# Patient Record
Sex: Male | Born: 1985 | Race: Black or African American | Hispanic: No | Marital: Married | Smoking: Never smoker
Health system: Southern US, Community
[De-identification: ages and names within clinical notes are randomized; demographics above are authoritative.]

## PROBLEM LIST (undated history)

## (undated) DIAGNOSIS — R5383 Other fatigue: Secondary | ICD-10-CM

## (undated) DIAGNOSIS — R55 Syncope and collapse: Secondary | ICD-10-CM

## (undated) DIAGNOSIS — Z8659 Personal history of other mental and behavioral disorders: Secondary | ICD-10-CM

## (undated) DIAGNOSIS — R419 Unspecified symptoms and signs involving cognitive functions and awareness: Secondary | ICD-10-CM

## (undated) HISTORY — DX: Syncope and collapse: R55

## (undated) HISTORY — PX: NO PAST SURGERIES: SHX2092

## (undated) HISTORY — DX: Personal history of other mental and behavioral disorders: Z86.59

---
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
## 2011-11-06 ENCOUNTER — Encounter (HOSPITAL_COMMUNITY): Payer: Self-pay | Admitting: Emergency Medicine

## 2011-11-06 ENCOUNTER — Emergency Department (HOSPITAL_COMMUNITY)
Admission: EM | Admit: 2011-11-06 | Discharge: 2011-11-06 | Disposition: A | Discharge status: Home / Self Care | Payer: No Typology Code available for payment source | Attending: Emergency Medicine | Admitting: Emergency Medicine

## 2011-11-06 ENCOUNTER — Emergency Department (HOSPITAL_COMMUNITY): Payer: No Typology Code available for payment source

## 2011-11-06 DIAGNOSIS — M542 Cervicalgia: Secondary | ICD-10-CM | POA: Insufficient documentation

## 2011-11-06 DIAGNOSIS — R55 Syncope and collapse: Secondary | ICD-10-CM | POA: Insufficient documentation

## 2011-11-06 DIAGNOSIS — Y92410 Unspecified street and highway as the place of occurrence of the external cause: Secondary | ICD-10-CM | POA: Insufficient documentation

## 2011-11-06 MED ORDER — LORAZEPAM 1 MG PO TABS
1.0000 mg | ORAL_TABLET | Freq: Once | ORAL | Status: AC
Start: 2011-11-06 — End: 2011-11-06
  Administered 2011-11-06: 1 mg via ORAL
  Filled 2011-11-06: qty 1

## 2011-11-06 MED ORDER — MELOXICAM 15 MG PO TABS: 15.0000 mg | ORAL_TABLET | Freq: Every day | ORAL | Status: DC

## 2011-11-06 MED ORDER — OXYCODONE-ACETAMINOPHEN 5-325 MG PO TABS
1.0000 | ORAL_TABLET | Freq: Once | ORAL | Status: AC
  Administered 2011-11-06: 1 via ORAL
  Filled 2011-11-06: qty 1

## 2011-11-06 NOTE — ED Notes (Signed)
Back board removed with assist of PA

## 2011-11-06 NOTE — ED Notes (Signed)
Patient given discharge instructions, information, prescriptions, and diet order. Patient states that they adequately understand discharge information given and to return to ED if symptoms return or worsen.     
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
## 2011-11-06 NOTE — ED Notes (Signed)
ZOX:[id number]<AV> Expected date:<BR> Expected time:<BR> Means of arrival:<BR> Comments:<BR> Hold for triage 6

## 2011-11-06 NOTE — ED Notes (Signed)
PA at bedside.
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
## 2011-11-11 NOTE — ED Provider Notes (Signed)
Medical screening examination/treatment/procedure(s) were conducted as a shared visit with non-physician practitioner(s) and myself.  I personally evaluated the patient during the encounter  Toy Baker, MD 11/11/11 [phone]

## 2012-12-28 ENCOUNTER — Encounter (HOSPITAL_COMMUNITY): Payer: Self-pay | Admitting: Emergency Medicine

## 2012-12-28 ENCOUNTER — Emergency Department (HOSPITAL_COMMUNITY)
Admission: EM | Admit: 2012-12-28 | Discharge: 2012-12-28 | Disposition: A | Discharge status: Home / Self Care | Payer: BC Managed Care – PPO | Attending: Emergency Medicine | Admitting: Emergency Medicine

## 2012-12-28 DIAGNOSIS — M545 Low back pain, unspecified: Secondary | ICD-10-CM | POA: Insufficient documentation

## 2012-12-28 MED ORDER — METHOCARBAMOL 500 MG PO TABS: 500.0000 mg | ORAL_TABLET | Freq: Two times a day (BID) | ORAL | Status: DC

## 2012-12-28 MED ORDER — NAPROXEN 500 MG PO TABS: 500.0000 mg | ORAL_TABLET | Freq: Two times a day (BID) | ORAL | Status: DC

## 2012-12-28 MED ORDER — HYDROCODONE-ACETAMINOPHEN 5-325 MG PO TABS: 1.0000 | ORAL_TABLET | Freq: Four times a day (QID) | ORAL | Status: DC | PRN

## 2012-12-28 NOTE — ED Notes (Signed)
Pt c/o of right side flank pain that started 2 days. Denies n/v/d.

## 2012-12-28 NOTE — ED Provider Notes (Signed)
CSN: [id number]     Arrival date & time 12/28/12  1602 History   First MD Initiated Contact with Patient 12/28/12 1643     Chief Complaint  Patient presents with  . Back Pain   (Consider location/radiation/quality/duration/timing/severity/associated sxs/prior Treatment) HPI Comments: Patient presents with a chief complaint of right lower back pain.  He reports that the pain has been constant for the past 2 days.  Pain worse with movement.  Pain does not radiate.  He has taken Ibuprofen for the pain, but does not feel that it helps.  He reports that he works for [employer] and does a lot of lifting and bending at work.  He denies any acute injury or trauma.  Denies bowel or bladder incontinence, fever, chills, urinary symptoms, numbness, or tingling.    The history is provided by the patient.    History reviewed. No pertinent past medical history. History reviewed. No pertinent past surgical history. History reviewed. No pertinent family history. History  Substance Use Topics  . Smoking status: Never Smoker   . Smokeless tobacco: Not on file  . Alcohol Use: No    Review of Systems  Musculoskeletal: Positive for back pain.  All other systems reviewed and are negative.    Allergies  Review of patient's allergies indicates no known allergies.  Home Medications   Current Outpatient Rx  Name  Route  Sig  Dispense  Refill  . acetaminophen (TYLENOL) 500 MG tablet   Oral   Take 1,000 mg by mouth every 6 (six) hours as needed. For pain.         Marland Kitchen HYDROcodone-acetaminophen (NORCO/VICODIN) 5-325 MG per tablet   Oral   Take 1-2 tablets by mouth every 6 (six) hours as needed.   12 tablet   0   . meloxicam (MOBIC) 15 MG tablet   Oral   Take 1 tablet (15 mg total) by mouth daily.   10 tablet   0   . methocarbamol (ROBAXIN) 500 MG tablet   Oral   Take 1 tablet (500 mg total) by mouth 2 (two) times daily.   20 tablet   0   . naproxen (NAPROSYN) 500 MG tablet   Oral   Take 1  tablet (500 mg total) by mouth 2 (two) times daily.   30 tablet   0    BP 134/78  Pulse 69  Temp(Src) 98.9 F (37.2 C) (Oral)  Resp 17  SpO2 100% Physical Exam  Nursing note and vitals reviewed. Constitutional: He appears well-developed and well-nourished.  HENT:  Head: Normocephalic and atraumatic.  Mouth/Throat: Oropharynx is clear and moist.  Neck: Normal range of motion. Neck supple.  Cardiovascular: Normal rate, regular rhythm and normal heart sounds.   Pulmonary/Chest: Effort normal and breath sounds normal.  Musculoskeletal: Normal range of motion.       Cervical back: He exhibits normal range of motion, no tenderness, no bony tenderness, no swelling, no edema and no deformity.       Thoracic back: He exhibits normal range of motion, no tenderness, no bony tenderness, no swelling, no edema and no deformity.       Lumbar back: He exhibits normal range of motion, no bony tenderness, no swelling, no edema and no deformity.  Right paraspinal lumbar tenderness to palpation  Neurological: He is alert. He has normal strength. No sensory deficit. Gait normal.  Reflex Scores:      Patellar reflexes are 2+ on the right side and 2+ on the left  side.      Achilles reflexes are 2+ on the right side and 2+ on the left side. Skin: Skin is warm and dry.  Psychiatric: He has a normal mood and affect.    ED Course  Procedures (including critical care time) Labs Review Labs Reviewed - No data to display Imaging Review No results found.  EKG Interpretation   None       MDM   1. Lower back pain   Patient with back pain.  No neurological deficits and normal neuro exam.  Patient can walk but states is painful.  No loss of bowel or bladder control.  No concern for cauda equina.  No fever, night sweats, weight loss, or h/o cancer.  RICE protocol and pain medicine indicated and discussed with patient.   Return precautions given.     Santiago Glad, PA-C 12/28/12 2239

## 2012-12-28 NOTE — ED Provider Notes (Signed)
Medical screening examination/treatment/procedure(s) were performed by non-physician practitioner and as supervising physician I was immediately available for consultation/collaboration.    Wasim Hurlbut L Arelly Whittenberg, MD 12/28/12 2312 

## 2013-02-07 ENCOUNTER — Emergency Department (HOSPITAL_COMMUNITY): Payer: BC Managed Care – PPO

## 2013-02-07 ENCOUNTER — Encounter (HOSPITAL_COMMUNITY): Payer: Self-pay | Admitting: Emergency Medicine

## 2013-02-07 ENCOUNTER — Emergency Department (HOSPITAL_COMMUNITY)
Admission: EM | Admit: 2013-02-07 | Discharge: 2013-02-07 | Disposition: A | Discharge status: Home / Self Care | Payer: BC Managed Care – PPO | Attending: Emergency Medicine | Admitting: Emergency Medicine

## 2013-02-07 DIAGNOSIS — R109 Unspecified abdominal pain: Secondary | ICD-10-CM | POA: Insufficient documentation

## 2013-02-07 DIAGNOSIS — R630 Anorexia: Secondary | ICD-10-CM | POA: Insufficient documentation

## 2013-02-07 DIAGNOSIS — R5381 Other malaise: Secondary | ICD-10-CM | POA: Insufficient documentation

## 2013-02-07 DIAGNOSIS — R197 Diarrhea, unspecified: Secondary | ICD-10-CM | POA: Insufficient documentation

## 2013-02-07 DIAGNOSIS — R112 Nausea with vomiting, unspecified: Secondary | ICD-10-CM

## 2013-02-07 DIAGNOSIS — R6883 Chills (without fever): Secondary | ICD-10-CM | POA: Insufficient documentation

## 2013-02-07 LAB — COMPREHENSIVE METABOLIC PANEL
ALT: 18 U/L (ref 0–53)
Alkaline Phosphatase: 59 U/L (ref 39–117)
CO2: 26 mEq/L (ref 19–32)
GFR calc Af Amer: >90 mL/min (ref >90)
Glucose, Bld: 123 mg/dL — ABNORMAL HIGH (ref 70–99)
Potassium: 4.4 mEq/L (ref 3.5–5.1)
Sodium: 137 mEq/L (ref 135–145)
Total Protein: 8.6 g/dL — ABNORMAL HIGH (ref 6.0–8.3)

## 2013-02-07 LAB — CBC WITH DIFFERENTIAL/PLATELET
Basophils Absolute: 0 10*3/uL (ref 0.0–0.1)
Basophils Relative: 0 % (ref 0–1)
Eosinophils Absolute: 0 10*3/uL (ref 0.0–0.7)
Eosinophils Relative: 0 % (ref 0–5)
HCT: 43.1 % (ref 39.0–52.0)
MCH: 26.4 pg (ref 26.0–34.0)
MCHC: 32 g/dL (ref 30.0–36.0)
Monocytes Absolute: 0.3 10*3/uL (ref 0.1–1.0)
Neutro Abs: 10.5 10*3/uL — ABNORMAL HIGH (ref 1.7–7.7)
RDW: 13.5 % (ref 11.5–15.5)

## 2013-02-07 MED ORDER — HYDROCODONE-ACETAMINOPHEN 5-325 MG PO TABS: 1.0000 | ORAL_TABLET | Freq: Four times a day (QID) | ORAL | Status: DC | PRN

## 2013-02-07 MED ORDER — ONDANSETRON HCL 4 MG/2ML IJ SOLN
4.0000 mg | Freq: Once | INTRAMUSCULAR | Status: AC
  Administered 2013-02-07: 4 mg via INTRAVENOUS
  Filled 2013-02-07: qty 2

## 2013-02-07 MED ORDER — IOHEXOL 300 MG/ML  SOLN
100.0000 mL | Freq: Once | INTRAMUSCULAR | Status: AC | PRN
  Administered 2013-02-07: 100 mL via INTRAVENOUS

## 2013-02-07 MED ORDER — SODIUM CHLORIDE 0.9 % IV BOLUS (SEPSIS)
1000.0000 mL | Freq: Once | INTRAVENOUS | Status: AC
  Administered 2013-02-07: 1000 mL via INTRAVENOUS

## 2013-02-07 MED ORDER — ONDANSETRON 4 MG PO TBDP: 4.0000 mg | ORAL_TABLET | Freq: Three times a day (TID) | ORAL | Status: DC | PRN

## 2013-02-07 MED ORDER — IOHEXOL 300 MG/ML  SOLN
25.0000 mL | INTRAMUSCULAR | Status: AC
  Administered 2013-02-07: 25 mL via ORAL

## 2013-02-07 NOTE — ED Provider Notes (Signed)
I saw and evaluated the patient, reviewed the resident's note and I agree with the findings and plan.   .Face to face Exam:  General:  Awake HEENT:  Atraumatic Resp:  Normal effort Abd:  Nondistended Neuro:No focal weakness    Nelia Shi, MD 02/07/13 1701

## 2013-02-07 NOTE — ED Notes (Signed)
MD at bedside. Dr Beaton 

## 2013-02-07 NOTE — ED Provider Notes (Signed)
CSN: [id number]     Arrival date & time 02/07/13  1144 History   None    Chief Complaint  Patient presents with  . Abdominal Pain  . Emesis  . Diarrhea   (Consider location/radiation/quality/duration/timing/severity/associated sxs/prior Treatment) HPI Mr. Justin Novak is a 27 y.o. male w/ no known PMHx, presents to the ED w/ complaints of abdominal pain, nausea, vomiting, and diarrhea, since last night. He claims the pain started first, described as periumbilical in location, slightly more right-sided, sharp in nature, 8/10 in severity, worsened with movement. The patient then claims he had diarrhea to follow, then with nausea and vomiting. This AM, he claims he has not been able to keep anything down except for a small amount of water, and continued to have significant abdominal pain. He also describes some associated chills and decrease in appetite. He denies any recent sick contacts and said the only strange thing he ate in the past 24 hours was a milk shake last night.   History reviewed. No pertinent past medical history. History reviewed. No pertinent past surgical history. History reviewed. No pertinent family history. History  Substance Use Topics  . Smoking status: Never Smoker   . Smokeless tobacco: Not on file  . Alcohol Use: No    Review of Systems General: Positive for chills, fatigue, decreased appetite. Denies fever, diaphoresis.  Respiratory: Denies SOB, DOE, cough, chest tightness, and wheezing.   Cardiovascular: Denies chest pain, palpitations and leg swelling.  Gastrointestinal: Positive for nausea, vomiting, diarrhea, and abdominal pain. Denies constipation, blood in stool and abdominal distention.  Genitourinary: Denies dysuria, urgency, frequency, hematuria, flank pain and difficulty urinating.  Endocrine: Denies hot or cold intolerance, sweats, polyuria, polydipsia. Musculoskeletal: Denies myalgias, back pain, joint swelling, arthralgias and gait problem.   Skin: Denies pallor, rash and wounds.  Neurological: Denies dizziness, seizures, syncope, weakness, lightheadedness, numbness and headaches.  Psychiatric/Behavioral: Denies mood changes, confusion, nervousness, sleep disturbance and agitation.  Allergies  Review of patient's allergies indicates no known allergies.  Home Medications  No current outpatient prescriptions on file. Physical Exam Filed Vitals:   02/07/13 1148 02/07/13 1350 02/07/13 1500  BP: 134/80 133/70 123/52  Pulse: 81 86 77  Temp: 99 F (37.2 C)    TempSrc: Oral    Resp: 17 19   Weight: 166 lb (75.297 kg)    SpO2: 100% 100% 100%  General: Vital signs reviewed.  Patient is a well-developed and well-nourished, in no acute distress and cooperative with exam. Alert and oriented x3.  Head: Normocephalic and atraumatic. Eyes: PERRL, EOMI, conjunctivae normal, No scleral icterus.  Neck: Supple, trachea midline, normal ROM, No JVD, masses, thyromegaly, or carotid bruit present.  Cardiovascular: RRR, S1 normal, S2 normal, no murmurs, gallops, or rubs. Pulmonary/Chest: Normal respiratory effort, CTAB, no wheezes, rales, or rhonchi. Abdominal: Soft, tender to palpation in periumbilical region, RLQ and epigastrium, non-distended, bowel sounds are normal, no masses, organomegaly. Mild guarding present, no rebound tenderness. -ve Rovsing's sign, -ve psoas sign.  Musculoskeletal: No joint deformities, erythema, or stiffness, ROM full and no nontender. Extremities: No swelling or edema,  pulses symmetric and intact bilaterally. No cyanosis or clubbing. Neurological: A&O x3, Strength is normal and symmetric bilaterally, cranial nerve II-XII are grossly intact, no focal motor deficit, sensory intact to light touch bilaterally.  Skin: Warm, dry and intact. No rashes or erythema. Psychiatric: Normal mood and affect. speech and behavior is normal. Cognition and memory are normal.   ED Course  Procedures (including critical care  time) Labs Review Labs Reviewed  COMPREHENSIVE METABOLIC PANEL - Abnormal; Notable for the following:    Glucose, Bld 123 (*)    Total Protein 8.6 (*)    All other components within normal limits  CBC WITH DIFFERENTIAL - Abnormal; Notable for the following:    WBC 11.1 (*)    Neutrophils Relative % 94 (*)    Neutro Abs 10.5 (*)    Lymphocytes Relative 3 (*)    Lymphs Abs 0.4 (*)    All other components within normal limits  LIPASE, BLOOD   Imaging Review No results found.  EKG Interpretation   None      MDM   Mr. Justin Novak is a 27 y.o. male w/ no known PMHx, presents to the ED w/ complaints of nausea, vomiting, diarrhea, and abdominal pain since last night. Most likely a viral gastroenteritis, however, some concern for appendicitis given clinical description. -CBC shows leukocytosis of 11.1 w/ PMN's of 94% -CMET wnl -Lipase normal -NS 1L bolus -Zofran 4 mg IV -CT abdomen/pelvis shows no definite explanation for patient's abdominal pain. Specifically, no evidence of enteric or urinary obstruction and a normal appearing appendix.   After workup, likely 2/2 gastroenteritis. Given patient's clinical presentation at this time, patient stable for discharge home w/ Zofran 4 mg q8h prn nausea + Norco 5-325 for abdominal pain.  Courtney Paris, MD 02/07/13 [phone]

## 2013-02-07 NOTE — ED Notes (Signed)
Pt c/o abd pain with N/V/D x 2 days

## 2013-09-19 ENCOUNTER — Encounter (HOSPITAL_COMMUNITY): Payer: Self-pay | Admitting: Emergency Medicine

## 2013-09-19 ENCOUNTER — Emergency Department (HOSPITAL_COMMUNITY)
Admission: EM | Admit: 2013-09-19 | Discharge: 2013-09-19 | Disposition: A | Discharge status: Home / Self Care | Payer: BC Managed Care – PPO | Attending: Emergency Medicine | Admitting: Emergency Medicine

## 2013-09-19 DIAGNOSIS — K921 Melena: Secondary | ICD-10-CM | POA: Insufficient documentation

## 2013-09-19 DIAGNOSIS — K625 Hemorrhage of anus and rectum: Secondary | ICD-10-CM

## 2013-09-19 DIAGNOSIS — K5900 Constipation, unspecified: Secondary | ICD-10-CM | POA: Insufficient documentation

## 2013-09-19 LAB — POC OCCULT BLOOD, ED: FECAL OCCULT BLD: POSITIVE — AB

## 2013-09-19 MED ORDER — DOCUSATE SODIUM 100 MG PO CAPS: 100.0000 mg | ORAL_CAPSULE | Freq: Two times a day (BID) | ORAL | Status: DC

## 2013-09-19 NOTE — ED Provider Notes (Signed)
CSN: [id number]     Arrival date & time 09/19/13  4098 History   First MD Initiated Contact with Patient 09/19/13 [phone]     Chief Complaint  Patient presents with  . Rectal Bleeding   Patient is a 28 y.o. male presenting with hematochezia.  Rectal Bleeding Associated symptoms: no abdominal pain, no fever and no vomiting    Pt is 28 yo AA male presents with report of blood in stool x 1 week.  He reports blood is noted only when having bowel movements.  He normally has bowel movement every 2-3 days.  He has had 4 bowel movements since he noticed the blood in his stool.  The stool is described as large, hard and brown with bright red bloody streaks on the stool and on the toilet paper.  He denies pain other than when having a bowel movement, he denies fever, nausea, vomiting, diarrhea or cramping.  He denies any bleeding in between bowel movements or any dark or tarry stools.    History reviewed. No pertinent past medical history. History reviewed. No pertinent past surgical history. No family history on file. History  Substance Use Topics  . Smoking status: Never Smoker   . Smokeless tobacco: Not on file  . Alcohol Use: No    Review of Systems  Constitutional: Negative for fever, chills and fatigue.  Respiratory: Negative for shortness of breath.   Gastrointestinal: Positive for constipation, blood in stool, hematochezia, anal bleeding and rectal pain. Negative for nausea, vomiting, abdominal pain, diarrhea and abdominal distention.  Genitourinary: Negative for dysuria and difficulty urinating.  Skin: Negative for color change, pallor and rash.  All other systems reviewed and are negative.   Allergies  Review of patient's allergies indicates no known allergies.  Home Medications   Prior to Admission medications   Not on File   BP 151/60  Pulse 75  Temp(Src) 98.2 F (36.8 C) (Oral)  Resp 20  SpO2 100% Physical Exam  Nursing note and vitals reviewed. Constitutional: He is  oriented to person, place, and time. Vital signs are normal. He appears well-developed and well-nourished. No distress.  HENT:  Head: Normocephalic and atraumatic.  Eyes: Conjunctivae and EOM are normal. Pupils are equal, round, and reactive to light. No scleral icterus.  Neck: Normal range of motion.  Cardiovascular: Normal rate.   Pulmonary/Chest: Effort normal.  Abdominal: Soft. Normal appearance and bowel sounds are normal. He exhibits no distension and no mass. There is no tenderness. There is no rigidity, no rebound and no guarding.  Hemoccult result: positive  Genitourinary: Rectum normal. Rectal exam shows no fissure, no mass, no tenderness and anal tone normal.  Stool appears light brown on glove, no visual blood noted.  Musculoskeletal: Normal range of motion.  Neurological: He is alert and oriented to person, place, and time. He has normal reflexes.  Skin: Skin is warm, dry and intact. No rash noted. He is not diaphoretic. No pallor.  Psychiatric: He has a normal mood and affect. His behavior is normal. Judgment and thought content normal.    ED Course  Procedures  Labs Review Labs Reviewed  POC OCCULT BLOOD, ED   Imaging Review No results found.   EKG Interpretation None      MDM   Final diagnoses:  Rectal bleeding   Pt presents to the ED with c/o blood noted with bowel movements x 1 week.  He has had He reports blood is noted only when having bowel movements.  He normally  has bowel movement every 2-3 days.  He has had 4 bowel movements since he noticed the blood in his stool.  The stool is described as large, hard and brown with bright red bloody streaks on the stool and on the toilet paper.  He denies pain other than when having a bowel movement, he denies fever, nausea, vomiting, diarrhea or cramping.  He denies any bleeding in between bowel movements or any dark or tarry stools.  His abdominal exam is completely benign and he was discharged home with instructions  to increase water intake, increase intake of high fiber foods, and prescription for Colace.  Return precautions given.     Harle BattiestElizabeth Katora Fini, NP 09/19/13 1642

## 2013-09-19 NOTE — Discharge Instructions (Signed)
Please follow the instructions listed below.  It is important for you to increase how much water you drink a day and eat more food with fiber like fruits and vegetables.  Take your medications as directed.  It is important to follow up with your primary care provider regarding these symptoms. If your symptoms don't improve, follow-up with the GI (stomach and intestine) doctors.  Don't hesitate to return to the emergency department for worsening pain, fever, nausea and vomiting, abdominal pain, dizziness, weakness, or inability to tolerate fluids by mouth.   Bloody Stools Bloody stools often mean that there is a problem in the digestive tract. Your caregiver may use the term "melena" to describe black, tarry, and bad smelling stools or "hematochezia" to describe red or maroon-colored stools. Blood seen in the stool can be caused by bleeding anywhere along the intestinal tract.  A black stool usually means that blood is coming from the upper part of the gastrointestinal tract (esophagus, stomach, or small bowel). Passing maroon-colored stools or bright red blood usually means that blood is coming from lower down in the large bowel or the rectum. However, sometimes massive bleeding in the stomach or small intestine can cause bright red bloody stools.  Consuming black licorice, lead, iron pills, medicines containing bismuth subsalicylate, or blueberries can also cause black stools. Your caregiver can test black stools to see if blood is present. It is important that the cause of the bleeding be found. Treatment can then be started, and the problem can be corrected. Rectal bleeding may not be serious, but you should not assume everything is okay until you know the cause.It is very important to follow up with your caregiver or a specialist in gastrointestinal problems. CAUSES  Blood in the stools can come from various underlying causes.Often, the cause is not found during your first visit. Testing is often needed  to discover the cause of bleeding in the gastrointestinal tract. Causes range from simple to serious or even life-threatening.Possible causes include:  Hemorrhoids.These are veins that are full of blood (engorged) in the rectum. They cause pain, inflammation, and may bleed.  Anal fissures.These are areas of painful tearing which may bleed. They are often caused by passing hard stool.  Diverticulosis.These are pouches that form on the colon over time, with age, and may bleed significantly.  Diverticulitis.This is inflammation in areas with diverticulosis. It can cause pain, fever, and bloody stools, although bleeding is rare.  Proctitis and colitis. These are inflamed areas of the rectum or colon. They may cause pain, fever, and bloody stools.  Polyps and cancer. Colon cancer is a leading cause of preventable cancer death.It often starts out as precancerous polyps that can be removed during a colonoscopy, preventing progression into cancer. Sometimes, polyps and cancer may cause rectal bleeding.  Gastritis and ulcers.Bleeding from the upper gastrointestinal tract (near the stomach) may travel through the intestines and produce black, sometimes tarry, often bad smelling stools. In certain cases, if the bleeding is fast enough, the stools may not be black, but red and the condition may be life-threatening. SYMPTOMS  You may have stools that are bright red and bloody, that are normal color with blood on them, or that are dark black and tarry. In some cases, you may only have blood in the toilet bowl. Any of these cases need medical care. You may also have:  Pain at the anus or anywhere in the rectum.  Lightheadedness or feeling faint.  Extreme weakness.  Nausea or vomiting.  Fever. DIAGNOSIS Your caregiver may use the following methods to find the cause of your bleeding:  Taking a medical history. Age is important. Older people tend to develop polyps and cancer more often. If there  is anal pain and a hard, large stool associated with bleeding, a tear of the anus may be the cause. If blood drips into the toilet after a bowel movement, bleeding hemorrhoids may be the problem. The color and frequency of the bleeding are additional considerations. In most cases, the medical history provides clues, but seldom the final answer.  A visual and finger (digital) exam. Your caregiver will inspect the anal area, looking for tears and hemorrhoids. A finger exam can provide information when there is tenderness or a growth inside. In men, the prostate is also examined.  Endoscopy. Several types of small, long scopes (endoscopes) are used to view the colon.  In the office, your caregiver may use a rigid, or more commonly, a flexible viewing sigmoidoscope. This exam is called flexible sigmoidoscopy. It is performed in 5 to 10 minutes.  A more thorough exam is accomplished with a colonoscope. It allows your caregiver to view the entire 5 to 6 foot long colon. Medicine to help you relax (sedative) is usually given for this exam. Frequently, a bleeding lesion may be present beyond the reach of the sigmoidoscope. So, a colonoscopy may be the best exam to start with. Both exams are usually done on an outpatient basis. This means the patient does not stay overnight in the hospital or surgery center.  An upper endoscopy may be needed to examine your stomach. Sedation is used and a flexible endoscope is put in your mouth, down to your stomach.  A barium enema X-ray. This is an X-ray exam. It uses liquid barium inserted by enema into the rectum. This test alone may not identify an actual bleeding point. X-rays highlight abnormal shadows, such as those made by lumps (tumors), diverticuli, or colitis. TREATMENT  Treatment depends on the cause of your bleeding.   For bleeding from the stomach or colon, the caregiver doing your endoscopy or colonoscopy may be able to stop the bleeding as part of the  procedure.  Inflammation or infection of the colon can be treated with medicines.  Many rectal problems can be treated with creams, suppositories, or warm baths.  Surgery is sometimes needed.  Blood transfusions are sometimes needed if you have lost a lot of blood.  For any bleeding problem, let your caregiver know if you take aspirin or other blood thinners regularly. HOME CARE INSTRUCTIONS   Take any medicines exactly as prescribed.  Keep your stools soft by eating a diet high in fiber. Prunes (1 to 3 a day) work well for many people.  Drink enough water and fluids to keep your urine clear or pale yellow.  Take sitz baths if advised. A sitz bath is when you sit in a bathtub with warm water for 10 to 15 minutes to soak, soothe, and cleanse the rectal area.  If enemas or suppositories are advised, be sure you know how to use them. Tell your caregiver if you have problems with this.  Monitor your bowel movements to look for signs of improvement or worsening. SEEK MEDICAL CARE IF:   You do not improve in the time expected.  Your condition worsens after initial improvement.  You develop any new symptoms. SEEK IMMEDIATE MEDICAL CARE IF:   You develop severe or prolonged rectal bleeding.  You vomit blood.  You feel weak or faint.  You have a fever. MAKE SURE YOU:  Understand these instructions.  Will watch your condition.  Will get help right away if you are not doing well or get worse. Document Released: 01/24/2002 Document Revised: 04/28/2011 Document Reviewed: 06/21/2010 [facility] Patient Information 2015 [place], [place]. This information is not intended to replace advice given to you by your health care provider. Make sure you discuss any questions you have with your health care provider.  Bloody Stools Bloody stools often mean that there is a problem in the digestive tract. Your caregiver may use the term "melena" to describe black, tarry, and bad smelling stools or  "hematochezia" to describe red or maroon-colored stools. Blood seen in the stool can be caused by bleeding anywhere along the intestinal tract.  A black stool usually means that blood is coming from the upper part of the gastrointestinal tract (esophagus, stomach, or small bowel). Passing maroon-colored stools or bright red blood usually means that blood is coming from lower down in the large bowel or the rectum. However, sometimes massive bleeding in the stomach or small intestine can cause bright red bloody stools.  Consuming black licorice, lead, iron pills, medicines containing bismuth subsalicylate, or blueberries can also cause black stools. Your caregiver can test black stools to see if blood is present. It is important that the cause of the bleeding be found. Treatment can then be started, and the problem can be corrected. Rectal bleeding may not be serious, but you should not assume everything is okay until you know the cause.It is very important to follow up with your caregiver or a specialist in gastrointestinal problems. CAUSES  Blood in the stools can come from various underlying causes.Often, the cause is not found during your first visit. Testing is often needed to discover the cause of bleeding in the gastrointestinal tract. Causes range from simple to serious or even life-threatening.Possible causes include:  Hemorrhoids.These are veins that are full of blood (engorged) in the rectum. They cause pain, inflammation, and may bleed.  Anal fissures.These are areas of painful tearing which may bleed. They are often caused by passing hard stool.  Diverticulosis.These are pouches that form on the colon over time, with age, and may bleed significantly.  Diverticulitis.This is inflammation in areas with diverticulosis. It can cause pain, fever, and bloody stools, although bleeding is rare.  Proctitis and colitis. These are inflamed areas of the rectum or colon. They may cause pain,  fever, and bloody stools.  Polyps and cancer. Colon cancer is a leading cause of preventable cancer death.It often starts out as precancerous polyps that can be removed during a colonoscopy, preventing progression into cancer. Sometimes, polyps and cancer may cause rectal bleeding.  Gastritis and ulcers.Bleeding from the upper gastrointestinal tract (near the stomach) may travel through the intestines and produce black, sometimes tarry, often bad smelling stools. In certain cases, if the bleeding is fast enough, the stools may not be black, but red and the condition may be life-threatening. SYMPTOMS  You may have stools that are bright red and bloody, that are normal color with blood on them, or that are dark black and tarry. In some cases, you may only have blood in the toilet bowl. Any of these cases need medical care. You may also have:  Pain at the anus or anywhere in the rectum.  Lightheadedness or feeling faint.  Extreme weakness.  Nausea or vomiting.  Fever. DIAGNOSIS Your caregiver may use the following methods to find  the cause of your bleeding:  Taking a medical history. Age is important. Older people tend to develop polyps and cancer more often. If there is anal pain and a hard, large stool associated with bleeding, a tear of the anus may be the cause. If blood drips into the toilet after a bowel movement, bleeding hemorrhoids may be the problem. The color and frequency of the bleeding are additional considerations. In most cases, the medical history provides clues, but seldom the final answer.  A visual and finger (digital) exam. Your caregiver will inspect the anal area, looking for tears and hemorrhoids. A finger exam can provide information when there is tenderness or a growth inside. In men, the prostate is also examined.  Endoscopy. Several types of small, long scopes (endoscopes) are used to view the colon.  In the office, your caregiver may use a rigid, or more  commonly, a flexible viewing sigmoidoscope. This exam is called flexible sigmoidoscopy. It is performed in 5 to 10 minutes.  A more thorough exam is accomplished with a colonoscope. It allows your caregiver to view the entire 5 to 6 foot long colon. Medicine to help you relax (sedative) is usually given for this exam. Frequently, a bleeding lesion may be present beyond the reach of the sigmoidoscope. So, a colonoscopy may be the best exam to start with. Both exams are usually done on an outpatient basis. This means the patient does not stay overnight in the hospital or surgery center.  An upper endoscopy may be needed to examine your stomach. Sedation is used and a flexible endoscope is put in your mouth, down to your stomach.  A barium enema X-ray. This is an X-ray exam. It uses liquid barium inserted by enema into the rectum. This test alone may not identify an actual bleeding point. X-rays highlight abnormal shadows, such as those made by lumps (tumors), diverticuli, or colitis. TREATMENT  Treatment depends on the cause of your bleeding.   For bleeding from the stomach or colon, the caregiver doing your endoscopy or colonoscopy may be able to stop the bleeding as part of the procedure.  Inflammation or infection of the colon can be treated with medicines.  Many rectal problems can be treated with creams, suppositories, or warm baths.  Surgery is sometimes needed.  Blood transfusions are sometimes needed if you have lost a lot of blood.  For any bleeding problem, let your caregiver know if you take aspirin or other blood thinners regularly. HOME CARE INSTRUCTIONS   Take any medicines exactly as prescribed.  Keep your stools soft by eating a diet high in fiber. Prunes (1 to 3 a day) work well for many people.  Drink enough water and fluids to keep your urine clear or pale yellow.  Take sitz baths if advised. A sitz bath is when you sit in a bathtub with warm water for 10 to 15 minutes  to soak, soothe, and cleanse the rectal area.  If enemas or suppositories are advised, be sure you know how to use them. Tell your caregiver if you have problems with this.  Monitor your bowel movements to look for signs of improvement or worsening. SEEK MEDICAL CARE IF:   You do not improve in the time expected.  Your condition worsens after initial improvement.  You develop any new symptoms. SEEK IMMEDIATE MEDICAL CARE IF:   You develop severe or prolonged rectal bleeding.  You vomit blood.  You feel weak or faint.  You have a fever. MAKE  SURE YOU:  Understand these instructions.  Will watch your condition.  Will get help right away if you are not doing well or get worse. Document Released: 01/24/2002 Document Revised: 04/28/2011 Document Reviewed: 06/21/2010 [facility] Patient Information 2015 [place], [place]. This information is not intended to replace advice given to you by your health care provider. Make sure you discuss any questions you have with your health care provider.   Emergency Department Resource Guide 1) Find a Doctor and Pay Out of Pocket Although you won't have to find out who is covered by your insurance plan, it is a good idea to ask around and get recommendations. You will then need to call the office and see if the doctor you have chosen will accept you as a new patient and what types of options they offer for patients who are self-pay. Some doctors offer discounts or will set up payment plans for their patients who do not have insurance, but you will need to ask so you aren't surprised when you get to your appointment.  2) Contact Your Local Health Department Not all health departments have doctors that can see patients for sick visits, but many do, so it is worth a call to see if yours does. If you don't know where your local health department is, you can check in your phone book. The CDC also has a tool to help you locate your state's health department,  and many state websites also have listings of all of their local health departments.  3) Find a Walk-in Clinic If your illness is not likely to be very severe or complicated, you may want to try a walk in clinic. These are popping up all over the country in pharmacies, drugstores, and shopping centers. They're usually staffed by nurse practitioners or physician assistants that have been trained to treat common illnesses and complaints. They're usually fairly quick and inexpensive. However, if you have serious medical issues or chronic medical problems, these are probably not your best option.  No Primary Care Doctor: - Call Health Connect at  [phone] - they can help you locate a primary care doctor that  accepts your insurance, provides certain services, etc. - Physician Referral Service- [phone]  Chronic Pain Problems: Organization         Address  Phone   Notes  [facility]  [phone] Patients need to be referred by their primary care doctor.   Medication Assistance: Organization         Address  Phone   Notes  [facility] Medication [facility] [address] [place] [place], [place] [address] [phone] --Must be a resident of [facility] -- Must have NO insurance coverage whatsoever (no Medicaid/ Medicare, etc.) -- The pt. MUST have a primary care doctor that directs their care regularly and follows them in the community   [facility]  [phone]   [employer]  [phone]    Agencies that provide inexpensive medical care: Organization         Address  Phone   Notes  [facility]  [phone]   [facility]    [phone]   [facility] [address] [place], [place] [address] [phone]   [facility] [address], [place] [phone]   [facility]anned Parenthood    [phone]   [facility]    [phone]   [facility]  [address], [place] Phone:  [phone], Fax:  [phone] Hours of Operation:  9 am - 6 pm, M-F.  Also accepts Medicaid/Medicare and self-pay.  [facility]  [address], [place] Phone: [phone], Fax: [phone]. Hours of Operation:  8:30 am - 5:30 pm, M-F.  Also accepts Medicaid and self-pay.  [facility] [place] [address], [place] Phone: [phone]   [facility] [address] Natasha Bence [place], [place] [phone], Ext. [phone] Mondays & Thursdays: 7-9 AM.  First 15 patients are seen on a first come, first serve basis.    Medicaid-accepting [facility] Providers:  Organization         Address  Phone   Notes  [facility] [address]  [phone] Also accepts self-pay patients.  [facility] [address] Laurell Josephs [place], [place]  [phone]   [facility] [address], [place] [phone]   [facility] [address], [place] [phone]   Renaye Rakers [address], [place]   [phone] Only accepts [place] patients after they have their name applied to their card.   Self-Pay (no insurance) in [facility]:  Organization         Address  Phone   Notes  Sickle Cell Patients, [facility] [address] [place], [place] [phone]   [facility] [address] [place], [place] [phone]   [facility] [place]  [address], [place] [phone]   [facility]/Dr. Osei-Bonsu  [address], [place] or [address], [place] [phone] Phone number for both [place] and [place] locations is the same.  [facility] [address], [place] [phone]   [facility] [address], [place] or [address] [phone]   [facility] [address] [place] [phone], phone; [phone], fax Sees patients 1st and 3rd Saturday of every month.  Must not qualify for public or private insurance (i.e. Medicaid, Medicare, [place] Health Choice, Veterans' Benefits)  Household income should be no more than 200% of the poverty level The clinic cannot treat you if you are pregnant or think you are pregnant  Sexually transmitted diseases are not treated at the clinic.    Dental Care: Organization         Address  Phone  Notes  [facility] [address] [place], [place] [phone] Accepts children up to age 2 who are enrolled in [place] or [place] Health Choice; pregnant women with a Medicaid card; and children who have applied for Medicaid or [place] Health Choice, but were declined, whose parents can pay a reduced fee at time of service.  [facility] Department of [facility]  [address], [place] [phone] Accepts children up to age 64 who are enrolled in [place] or [place] Health Choice; pregnant women with a Medicaid card; and children who have applied for Medicaid or  Health Choice, but were declined, whose parents can pay a reduced fee at time of service.  [facility]  [address] [place], [place] [phone] Patients are seen by appointment only. Walk-ins are not accepted. [facility] will see patients 26 years of age and older. Monday - Tuesday (8am-5pm) Most Wednesdays (8:30-5pm) $30 per visit, cash only  [facility]  [address], [place] [phone] Patients are seen by appointment only. Walk-ins are not accepted. [facility] will see patients 26 years of age and older. One Wednesday Evening (Monthly: Volunteer Based).  $30 per visit,  cash only  [facility]  [phone] for adults; Children under age 714, call Graduate Pediatric Dentistry at [phone]. Children aged 184-14, please call [phone] to request a pediatric application.  Dental services are provided in all areas of dental care including fillings, crowns and bridges, complete and partial dentures, implants, gum treatment, root canals, and extractions. Preventive care is also provided. Treatment is provided to both adults and children. Patients are selected via a lottery and there is often a waiting list.   [facility] [address], [place]  [phone] [email or web address]   [facility] [address], [place], [place] [phone], Ext. [phone] Second and Fourth Thursday of each month, opens at 6:30 AM; Clinic ends at 9 AM.  Patients are seen on a first-come first-served basis, and a limited number are seen during each clinic.   [facility]  [address], [place], [place] [phone]   Eligibility Requirements You must have lived in [place], [place], or [place] counties for at least the last three months.   You cannot be eligible for state or federal sponsored [place] insurance, including [employer]IG[employer]eterans Administration, [place], or [employer]icare.   You generally cannot be eligible for healthcare insurance through your employer.    How to apply: Eligibility screenings are held every Tuesday and Wednesday afternoon from 1:00 pm until 4:00 pm. You do not need an appointment for the interview!  [facility] [address], [place], [place] [phone]   [facility]  [phone]   [facility]  [phone]   [facility] [facility]  [phone]    Behavioral Health Resources in the Community: Intensive Outpatient Programs Organization         Address  Phone  Notes  [facility] [address],  [place], [place] [phone]   [facility] [address], [place], [place] [phone]   [facility] [address], [place], [place]  [phone]   [facility] [address],  [place], [place] [phone] or [phone]   Substance Abuse Resources Organization         Address  Phone  Notes  [facility]  [phone]   [facility]  [phone]   [facility]  [phone]   [facility]  [phone]   Residential & Outpatient Substance Abuse Program  [phone]   Psychological Services Organization         Address  Phone  Notes  [facility]  [phone]- [phone]   [facility]  [phone]- [phone]   [facility] [address], [place] [phone] or [phone]    Mobile Crisis Teams Organization         Address  Phone  Notes  [facility], Mobile Crisis Care Unit  [phone]   [facility]  [address] [place], [place] [phone]   Doristine LocksSharon DeEsch [address] [place] [phone]    Self-Help/Support Groups Organization         Address  Phone             Notes  [facility] - variety of support groups  [phone] Call for more information  Narcotics Anonymous (NA), [facility] [address], [place]  2 meetings at this location   Statistician         Address  Phone  Notes  [facility] [address],    [place]  [phone]   [facility]  [address], [place] [address], [place], [place] [phone]   [facility] [address] [place], [place] [phone] Admissions: 8am-3pm M-F  [facility] [address],    [place], [place] [phone]   [facility] [address] [place], [place], [place] [phone]   [facility] [address]  [place], [place] [phone]   [facility] - Intensive Outpatient [address], [place], [place] [phone]   [facility] [address] [place]  [place], [place] [phone] or [phone]   [facility] ([facility]) [address], [place], [place] [phone] Accepts Medicaid  [facility] [address]  [place] [phone] Substance Abuse/Addiction Treatment   [facility] Organization         Address  Phone  Notes  [facility]  [phone]   Angie Fava, PhD [address] [place] [place], [place]   [phone] or [phone]   [facility]   [address] [place], [place] [phone]   [facility] [address], [place], [place] [phone] Insurance/Medicaid/sponsorship through [facility] and Families [address]                                    [place], [place] [phone] Therapy/tele-psych/case  [facility] [address], [place] [phone]    Dr. Lolly Mustache  [phone]   [facility]  [employer] [facility] Dept. 1) [address] [place] 2) [address] [place] 3)  [address], [place] [phone]  [phone]   [facility] Child Abuse Hotline [phone] or [phone] (After Hours)

## 2013-09-19 NOTE — ED Notes (Signed)
Pt c/o blood in stool x 1 week, described as bright red. States it has gotten worse. Denies abd pain.
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
## 2016-03-18 ENCOUNTER — Emergency Department (HOSPITAL_COMMUNITY)
Admission: EM | Admit: 2016-03-18 | Discharge: 2016-03-18 | Disposition: A | Discharge status: Home / Self Care | Payer: 59 | Attending: Emergency Medicine | Admitting: Emergency Medicine

## 2016-03-18 ENCOUNTER — Encounter (HOSPITAL_COMMUNITY): Payer: Self-pay | Admitting: Emergency Medicine

## 2016-03-18 DIAGNOSIS — R05 Cough: Secondary | ICD-10-CM | POA: Diagnosis not present

## 2016-03-18 DIAGNOSIS — B9789 Other viral agents as the cause of diseases classified elsewhere: Secondary | ICD-10-CM

## 2016-03-18 DIAGNOSIS — J069 Acute upper respiratory infection, unspecified: Secondary | ICD-10-CM | POA: Diagnosis not present

## 2016-03-18 MED ORDER — PROMETHAZINE-PHENYLEPHRINE 6.25-5 MG/5ML PO SYRP
5.0000 mL | ORAL_SOLUTION | ORAL | 0 refills | Status: DC | PRN
Start: 2016-03-18 — End: 2017-09-29

## 2016-03-18 MED ORDER — NAPROXEN 500 MG PO TABS: 500.0000 mg | ORAL_TABLET | Freq: Two times a day (BID) | ORAL | 0 refills | Status: DC

## 2016-03-18 NOTE — ED Triage Notes (Signed)
Pt c/o fever and cough runny nose at home. Taking ibuprofen and tylenol at home with relief. Pt in NAD

## 2016-03-18 NOTE — ED Provider Notes (Signed)
[facility] Provider Note   CSN: [id number] Arrival date & time: 03/18/16  1527   By signing my name below, I, Clarisse Gouge, attest that this documentation has been prepared under the direction and in the presence of Felicie Morn, FNP. Electronically signed, Clarisse Gouge, ED Scribe. 03/18/16. 4:38 PM.   History   Chief Complaint Chief Complaint  Patient presents with  . Fever  . Cough   The history is provided by medical records and the patient. No language interpreter was used.    HPI Comments: Justin Novak is a 31 y.o. male who presents to the Emergency Department complaining of waxing waning fevers x ~3 days. He notes rhinorrhea ~3 days ago relieved with benadryl, headache beginning the next day leading to a fever of 102.9 that subsided with tylenol and ibuprofen at home. He notes the headache persisted and his fever returned but wa relieved with tylenol until this morning. He notes a fever of 101 ~1 PM today prompting him to come into [facility]. Pt denies N/V/D, productive cough, sick contacts contacts or flu vaccination.  History reviewed. No pertinent past medical history.  There are no active problems to display for this patient.   History reviewed. No pertinent surgical history.     Home Medications    Prior to Admission medications   Medication Sig Start Date End Date Taking? Authorizing Provider  docusate sodium (COLACE) 100 MG capsule Take 1 capsule (100 mg total) by mouth every 12 (twelve) hours. 09/19/13   Harle Battiest, NP    Family History No family history on file.  Social History Social History  Substance Use Topics  . Smoking status: Never Smoker  . Smokeless tobacco: Not on file  . Alcohol use No     Allergies   Patient has no known allergies.   Review of Systems Review of Systems  All other systems reviewed and are negative.  A complete 10 system review of systems was obtained and all systems are negative except as noted in the  HPI and PMH.    Physical Exam Updated Vital Signs BP 132/75   Pulse 77   Temp 99.8 F (37.7 C) (Oral)   Resp 20   Ht 6\' 1"  (1.854 m)   Wt 173 lb (78.5 kg)   SpO2 100%   BMI 22.82 kg/m   Physical Exam  Constitutional: He is oriented to person, place, and time. Vital signs are normal. He appears well-developed and well-nourished.  Non-toxic appearance. No distress.  Afebrile, nontoxic, NAD  HENT:  Head: Normocephalic and atraumatic.  Nose: Mucosal edema present.  Mouth/Throat: Mucous membranes are normal.  Eyes: Conjunctivae and EOM are normal. Right eye exhibits no discharge. Left eye exhibits no discharge.  Neck: Normal range of motion. Neck supple.  Cardiovascular: Normal rate and intact distal pulses.   Pulmonary/Chest: Effort normal. No respiratory distress.  Abdominal: Normal appearance. He exhibits no distension.  Musculoskeletal: Normal range of motion.  Neurological: He is alert and oriented to person, place, and time. He has normal strength. No sensory deficit.  Skin: Skin is warm, dry and intact. No rash noted.  Psychiatric: He has a normal mood and affect.  Nursing note and vitals reviewed.    ED Treatments / Results  DIAGNOSTIC STUDIES: Oxygen Saturation is 100% on RA, normal by my interpretation.    COORDINATION OF CARE: 4:37 PM Discussed treatment plan with pt at bedside and pt agreed to plan. Will order cough medication and naproxen.  Labs (all labs  ordered are listed, but only abnormal results are displayed) Labs Reviewed - No data to display  EKG  EKG Interpretation None       Radiology No results found.  Procedures Procedures (including critical care time)  Medications Ordered in ED Medications - No data to display   Initial Impression / Assessment and Plan / ED Course  I have reviewed the triage vital signs and the nursing notes.  Pertinent labs & imaging results that were available during my care of the patient were reviewed by me  and considered in my medical decision making (see chart for details).    Pt symptoms consistent with URI.  Pt will be discharged with symptomatic treatment.  Discussed return precautions.  Pt is hemodynamically stable & in NAD prior to discharge.   Final Clinical Impressions(s) / ED Diagnoses   Final diagnoses:  Viral URI with cough    New Prescriptions Discharge Medication List as of 03/18/2016  4:41 PM    START taking these medications   Details  naproxen (NAPROSYN) 500 MG tablet Take 1 tablet (500 mg total) by mouth 2 (two) times daily., Starting Tue 03/18/2016, Print    promethazine-phenylephrine (PROMETHAZINE-PHENYLEPHRINE) 6.25-5 MG/5ML SYRP Take 5 mLs by mouth every 4 (four) hours as needed for congestion., Starting Tue 03/18/2016, Print      I personally performed the services described in this documentation, which was scribed in my presence. The recorded information has been reviewed and is accurate.     Felicie Mornavid Kalle Bernath, NP 03/18/16 1749    Margarita Grizzleanielle Ray, MD 03/19/16 1501

## 2016-04-08 ENCOUNTER — Ambulatory Visit: Payer: Self-pay

## 2016-04-08 ENCOUNTER — Other Ambulatory Visit: Payer: Self-pay | Admitting: Occupational Medicine

## 2016-04-08 DIAGNOSIS — M25572 Pain in left ankle and joints of left foot: Secondary | ICD-10-CM

## 2016-04-15 ENCOUNTER — Ambulatory Visit: Payer: Self-pay

## 2016-04-15 ENCOUNTER — Other Ambulatory Visit: Payer: Self-pay | Admitting: Occupational Medicine

## 2016-04-15 DIAGNOSIS — M25572 Pain in left ankle and joints of left foot: Secondary | ICD-10-CM

## 2016-06-14 ENCOUNTER — Emergency Department (HOSPITAL_COMMUNITY): Payer: 59

## 2016-06-14 ENCOUNTER — Encounter (HOSPITAL_COMMUNITY): Payer: Self-pay | Admitting: Emergency Medicine

## 2016-06-14 ENCOUNTER — Emergency Department (HOSPITAL_COMMUNITY)
Admission: EM | Admit: 2016-06-14 | Discharge: 2016-06-14 | Disposition: A | Discharge status: Home / Self Care | Payer: 59 | Attending: Emergency Medicine | Admitting: Emergency Medicine

## 2016-06-14 DIAGNOSIS — W458XXA Other foreign body or object entering through skin, initial encounter: Secondary | ICD-10-CM | POA: Diagnosis not present

## 2016-06-14 DIAGNOSIS — Y9389 Activity, other specified: Secondary | ICD-10-CM | POA: Insufficient documentation

## 2016-06-14 DIAGNOSIS — Z23 Encounter for immunization: Secondary | ICD-10-CM | POA: Diagnosis not present

## 2016-06-14 DIAGNOSIS — Y9289 Other specified places as the place of occurrence of the external cause: Secondary | ICD-10-CM | POA: Diagnosis not present

## 2016-06-14 DIAGNOSIS — S60454A Superficial foreign body of right ring finger, initial encounter: Secondary | ICD-10-CM | POA: Insufficient documentation

## 2016-06-14 DIAGNOSIS — M795 Residual foreign body in soft tissue: Secondary | ICD-10-CM

## 2016-06-14 DIAGNOSIS — T148XXA Other injury of unspecified body region, initial encounter: Secondary | ICD-10-CM

## 2016-06-14 DIAGNOSIS — Y990 Civilian activity done for income or pay: Secondary | ICD-10-CM | POA: Insufficient documentation

## 2016-06-14 DIAGNOSIS — R937 Abnormal findings on diagnostic imaging of other parts of musculoskeletal system: Secondary | ICD-10-CM | POA: Diagnosis not present

## 2016-06-14 MED ORDER — LIDOCAINE HCL (PF) 1 % IJ SOLN
5.0000 mL | Freq: Once | INTRAMUSCULAR | Status: AC
  Administered 2016-06-14: 5 mL
  Filled 2016-06-14: qty 5

## 2016-06-14 MED ORDER — TETANUS-DIPHTH-ACELL PERTUSSIS 5-2.5-18.5 LF-MCG/0.5 IM SUSP
0.5000 mL | Freq: Once | INTRAMUSCULAR | Status: AC
  Administered 2016-06-14: 0.5 mL via INTRAMUSCULAR
  Filled 2016-06-14: qty 0.5

## 2016-06-14 MED ORDER — LIDOCAINE-EPINEPHRINE-TETRACAINE (LET) SOLUTION
3.0000 mL | Freq: Once | NASAL | Status: AC
  Administered 2016-06-14: 3 mL via TOPICAL
  Filled 2016-06-14: qty 3

## 2016-06-14 MED ORDER — SULFAMETHOXAZOLE-TRIMETHOPRIM 800-160 MG PO TABS: 1.0000 | ORAL_TABLET | Freq: Two times a day (BID) | ORAL | 0 refills | Status: AC

## 2016-06-14 NOTE — ED Provider Notes (Addendum)
[facility] Provider Note   CSN: [id number] Arrival date & time: 06/14/16  1143  By signing my name below, I, Majel Homer, attest that this documentation has been prepared under the direction and in the presence of non-physician practitioner, Ok Edwards, PA-C. Electronically Signed: Majel Homer, Scribe. 06/14/2016. 2:04 PM.  History   Chief Complaint Chief Complaint  Patient presents with  . Foreign Body in Skin   The history is provided by the patient. No language interpreter was used.   HPI Comments: Justin Novak is a 31 y.o. male who presents to the Emergency Department for an evaluation of a possible foreign body to the tip of his right ring finger that occurred ~2 weeks ago. Pt reports he was at work 2 weeks ago when he attempted to pick up a block of wood and obtained a large splinter in his finger. He states he thought he removed the entire splinter at the time of the incident, but now believes there is still a small remainder of wood left in his finger. He notes "every time I try and take it out, it burrows deeper and deeper." Pt denies any other complaints.    History reviewed. No pertinent past medical history.  There are no active problems to display for this patient.  History reviewed. No pertinent surgical history.  Home Medications    Prior to Admission medications   Medication Sig Start Date End Date Taking? Authorizing Provider  docusate sodium (COLACE) 100 MG capsule Take 1 capsule (100 mg total) by mouth every 12 (twelve) hours. 09/19/13   Harle Battiest, NP  naproxen (NAPROSYN) 500 MG tablet Take 1 tablet (500 mg total) by mouth 2 (two) times daily. 03/18/16   Felicie Morn, NP  promethazine-phenylephrine (PROMETHAZINE-PHENYLEPHRINE) 6.25-5 MG/5ML SYRP Take 5 mLs by mouth every 4 (four) hours as needed for congestion. 03/18/16   Felicie Morn, NP    Family History No family history on file.  Social History Social History  Substance Use Topics  .  Smoking status: Never Smoker  . Smokeless tobacco: Not on file  . Alcohol use No   Allergies   Patient has no known allergies.  Review of Systems Review of Systems  Constitutional: Negative for fever.  Skin:       +foreign body to the tip of right ring finger    Physical Exam Updated Vital Signs BP 134/69 (BP Location: Right Arm)   Pulse 60   Temp 98.1 F (36.7 C) (Oral)   Resp 16   Ht  (1.854 m)   Wt 178 lb (80.7 kg)   SpO2 100%   BMI 23.48 kg/m   Physical Exam  Constitutional: He is oriented to person, place, and time. He appears well-developed and well-nourished.  HENT:  Head: Normocephalic.  Eyes: EOM are normal.  Neck: Normal range of motion.  Pulmonary/Chest: Effort normal.  Abdominal: He exhibits no distension.  Musculoskeletal: Normal range of motion.  Neurological: He is alert and oriented to person, place, and time.  Skin:  Right ring finger distal tip has a 4 mm area of scabbing with a small dark center.   Psychiatric: He has a normal mood and affect.  Nursing note and vitals reviewed.  ED Treatments / Results  DIAGNOSTIC STUDIES:  Oxygen Saturation is 100% on RA, normal by my interpretation.    COORDINATION OF CARE:  12:32 PM Discussed treatment plan with pt at bedside and pt agreed to plan.  Labs (all labs ordered are listed, but  only abnormal results are displayed) Labs Reviewed - No data to display  EKG  EKG Interpretation None       Radiology Dg Finger Ring Right  Result Date: 06/14/2016 CLINICAL DATA:  31 year old male with a history of fourth finger splinter 2 weeks ago. EXAM: RIGHT RING FINGER 2+V COMPARISON:  None. FINDINGS: No acute bony abnormality. Joints are congruent with no subluxation/ dislocation. No focal soft tissue swelling.  No radiopaque foreign body. IMPRESSION: Negative for acute bony abnormality. No radiopaque foreign body. Electronically Signed   By: Gilmer Mor D.O.   On: 06/14/2016 13:02     Procedures .Foreign Body Removal Date/Time: 06/14/2016 12:37 PM Performed by: Elson Areas Authorized by: Elson Areas  Consent: Verbal consent obtained. Consent given by: patient Patient identity confirmed: verbally with patient Body area: skin General location: upper extremity Location details: right ring finger Anesthesia: digital block  Anesthesia: Local Anesthetic: lidocaine 1% with epinephrine (0.5 cc ) Complexity: simple Post-procedure assessment: foreign body removed   Splinter removed with twizzers, no incision  Medications Ordered in ED Medications  lidocaine (PF) (XYLOCAINE) 1 % injection 5 mL (not administered)  Tdap (BOOSTRIX) injection 0.5 mL (0.5 mLs Intramuscular Given 06/14/16 1304)  lidocaine-EPINEPHrine-tetracaine (LET) solution (3 mLs Topical Given 06/14/16 1306)    Initial Impression / Assessment and Plan / ED Course  I have reviewed the triage vital signs and the nursing notes.  Pertinent labs & imaging results that were available during my care of the patient were reviewed by me and considered in my medical decision making (see chart for details).       Final Clinical Impressions(s) / ED Diagnoses   Final diagnoses:  Foreign body (FB) in soft tissue  Splinter    New Prescriptions Discharge Medication List as of 06/14/2016  2:05 PM    START taking these medications   Details  sulfamethoxazole-trimethoprim (BACTRIM DS,SEPTRA DS) 800-160 MG tablet Take 1 tablet by mouth 2 (two) times daily., Starting Sat 06/14/2016, Until Sat 06/21/2016, Print      An After Visit Summary was printed and given to the patient.    Lonia Skinner [place], PA-C 06/14/16 1528    Doug Sou, MD 06/14/16 [address] [place] 06/30/16 1644    Doug Sou, MD 07/02/16 1743

## 2016-06-14 NOTE — ED Triage Notes (Signed)
Pt states he got a splinter in his R ring finger 2 weeks ago and hasn't been able to get it out. Finger is swollen and bruised at the top of R ring finger.

## 2016-06-14 NOTE — ED Notes (Signed)
Declined W/C at D/C and was escorted to lobby by RN. 

## 2016-06-14 NOTE — ED Triage Notes (Signed)
PT called in front lobby with no answer. PA in POD-c informed .

## 2016-11-18 DIAGNOSIS — Z136 Encounter for screening for cardiovascular disorders: Secondary | ICD-10-CM | POA: Diagnosis not present

## 2016-11-18 DIAGNOSIS — Z01118 Encounter for examination of ears and hearing with other abnormal findings: Secondary | ICD-10-CM | POA: Diagnosis not present

## 2016-11-18 DIAGNOSIS — H538 Other visual disturbances: Secondary | ICD-10-CM | POA: Diagnosis not present

## 2016-11-18 DIAGNOSIS — Z0000 Encounter for general adult medical examination without abnormal findings: Secondary | ICD-10-CM | POA: Diagnosis not present

## 2017-09-29 ENCOUNTER — Emergency Department (HOSPITAL_COMMUNITY): Payer: 59

## 2017-09-29 ENCOUNTER — Encounter (HOSPITAL_COMMUNITY): Payer: Self-pay | Admitting: Student

## 2017-09-29 ENCOUNTER — Other Ambulatory Visit: Payer: Self-pay

## 2017-09-29 ENCOUNTER — Emergency Department (HOSPITAL_COMMUNITY)
Admission: EM | Admit: 2017-09-29 | Discharge: 2017-09-29 | Disposition: A | Discharge status: Home / Self Care | Payer: 59 | Attending: Emergency Medicine | Admitting: Emergency Medicine

## 2017-09-29 DIAGNOSIS — M549 Dorsalgia, unspecified: Secondary | ICD-10-CM | POA: Insufficient documentation

## 2017-09-29 DIAGNOSIS — M545 Low back pain: Secondary | ICD-10-CM | POA: Diagnosis not present

## 2017-09-29 DIAGNOSIS — M542 Cervicalgia: Secondary | ICD-10-CM | POA: Insufficient documentation

## 2017-09-29 DIAGNOSIS — Z79899 Other long term (current) drug therapy: Secondary | ICD-10-CM | POA: Insufficient documentation

## 2017-09-29 DIAGNOSIS — R51 Headache: Secondary | ICD-10-CM | POA: Diagnosis not present

## 2017-09-29 DIAGNOSIS — S299XXA Unspecified injury of thorax, initial encounter: Secondary | ICD-10-CM | POA: Diagnosis not present

## 2017-09-29 DIAGNOSIS — S0990XA Unspecified injury of head, initial encounter: Secondary | ICD-10-CM | POA: Diagnosis not present

## 2017-09-29 DIAGNOSIS — S3992XA Unspecified injury of lower back, initial encounter: Secondary | ICD-10-CM | POA: Diagnosis not present

## 2017-09-29 DIAGNOSIS — S199XXA Unspecified injury of neck, initial encounter: Secondary | ICD-10-CM | POA: Diagnosis not present

## 2017-09-29 MED ORDER — METHOCARBAMOL 500 MG PO TABS: 500.0000 mg | ORAL_TABLET | Freq: Three times a day (TID) | ORAL | 0 refills | Status: DC | PRN

## 2017-09-29 MED ORDER — OXYCODONE-ACETAMINOPHEN 5-325 MG PO TABS
1.0000 | ORAL_TABLET | Freq: Once | ORAL | Status: AC
  Administered 2017-09-29: 1 via ORAL
  Filled 2017-09-29: qty 1

## 2017-09-29 MED ORDER — KETOROLAC TROMETHAMINE 60 MG/2ML IM SOLN
60.0000 mg | Freq: Once | INTRAMUSCULAR | Status: AC
  Administered 2017-09-29: 60 mg via INTRAMUSCULAR
  Filled 2017-09-29: qty 2

## 2017-09-29 MED ORDER — NAPROXEN 500 MG PO TABS: 500.0000 mg | ORAL_TABLET | Freq: Two times a day (BID) | ORAL | 0 refills | Status: DC

## 2017-09-29 NOTE — ED Notes (Signed)
ED Provider at bedside. 
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
## 2017-09-29 NOTE — ED Notes (Signed)
Patient transported to CT 
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
## 2017-09-29 NOTE — ED Notes (Signed)
Patient verbalizes understanding of discharge instructions. Opportunity for questioning and answers were provided. Armband removed by staff, pt discharged from ED in wheelchair.  

## 2018-05-23 ENCOUNTER — Other Ambulatory Visit: Payer: Self-pay

## 2018-05-23 ENCOUNTER — Emergency Department (HOSPITAL_COMMUNITY)
Admission: EM | Admit: 2018-05-23 | Discharge: 2018-05-24 | Disposition: A | Discharge status: Home / Self Care | Payer: 59 | Attending: Emergency Medicine | Admitting: Emergency Medicine

## 2018-05-23 DIAGNOSIS — R0602 Shortness of breath: Secondary | ICD-10-CM | POA: Diagnosis not present

## 2018-05-23 DIAGNOSIS — B9789 Other viral agents as the cause of diseases classified elsewhere: Secondary | ICD-10-CM | POA: Diagnosis not present

## 2018-05-23 DIAGNOSIS — J069 Acute upper respiratory infection, unspecified: Secondary | ICD-10-CM | POA: Insufficient documentation

## 2018-05-23 DIAGNOSIS — Z79899 Other long term (current) drug therapy: Secondary | ICD-10-CM | POA: Insufficient documentation

## 2018-05-23 DIAGNOSIS — J020 Streptococcal pharyngitis: Secondary | ICD-10-CM | POA: Diagnosis not present

## 2018-05-23 DIAGNOSIS — J029 Acute pharyngitis, unspecified: Secondary | ICD-10-CM | POA: Diagnosis present

## 2018-05-24 ENCOUNTER — Encounter (HOSPITAL_COMMUNITY): Payer: Self-pay

## 2018-05-24 ENCOUNTER — Other Ambulatory Visit: Payer: Self-pay

## 2018-05-24 ENCOUNTER — Emergency Department (HOSPITAL_COMMUNITY): Payer: 59

## 2018-05-24 DIAGNOSIS — R0602 Shortness of breath: Secondary | ICD-10-CM | POA: Diagnosis not present

## 2018-05-24 LAB — GROUP A STREP BY PCR: Group A Strep by PCR: DETECTED — AB

## 2018-05-24 MED ORDER — PENICILLIN G BENZATHINE 1200000 UNIT/2ML IM SUSP
1.2000 10*6.[IU] | Freq: Once | INTRAMUSCULAR | Status: AC
  Administered 2018-05-24: 1.2 10*6.[IU] via INTRAMUSCULAR
  Filled 2018-05-24: qty 2

## 2018-05-24 MED ORDER — ACETAMINOPHEN 325 MG PO TABS
650.0000 mg | ORAL_TABLET | Freq: Once | ORAL | Status: AC
  Administered 2018-05-24: 650 mg via ORAL
  Filled 2018-05-24: qty 2

## 2018-05-24 NOTE — ED Triage Notes (Signed)
Pt reports scratchy throat Saturday morning, progressing to a dry cough sat night.No SHOB, no chest pain, no fever.

## 2018-05-24 NOTE — Discharge Instructions (Signed)
Your treated for possible strep throat today.  As we discussed the coronavirus is also a possibility.  You should isolate yourself at home and do not participate in work until you are symptom-free for 7 days. Follow-up with your doctor.  Return to the ED with worsening symptoms including chest pain, shortness of breath or other concerns.     Person Under Monitoring Name: [facility]  Location: [address] [place] [address]   Infection Prevention Recommendations for Individuals Confirmed to have, or Being Evaluated for, 2019 Novel Coronavirus (COVID-19) Infection Who Receive Care at Home  Individuals who are confirmed to have, or are being evaluated for, COVID-19 should follow the prevention steps below until a healthcare provider or local or state health department says they can return to normal activities.  Stay home except to get medical care You should restrict activities outside your home, except for getting medical care. Do not go to work, school, or public areas, and do not use public transportation or taxis.  Call ahead before visiting your doctor Before your medical appointment, call the healthcare provider and tell them that you have, or are being evaluated for, COVID-19 infection. This will help the healthcare providers office take steps to keep other people from getting infected. Ask your healthcare provider to call the local or state health department.  Monitor your symptoms Seek prompt medical attention if your illness is worsening (e.g., difficulty breathing). Before going to your medical appointment, call the healthcare provider and tell them that you have, or are being evaluated for, COVID-19 infection. Ask your healthcare provider to call the local or state health department.  Wear a facemask You should wear a facemask that covers your nose and mouth when you are in the same room with other people and when you visit a healthcare provider. People who  live with or visit you should also wear a facemask while they are in the same room with you.  Separate yourself from other people in your home As much as possible, you should stay in a different room from other people in your home. Also, you should use a separate bathroom, if available.  Avoid sharing household items You should not share dishes, drinking glasses, cups, eating utensils, towels, bedding, or other items with other people in your home. After using these items, you should wash them thoroughly with soap and water.  Cover your coughs and sneezes Cover your mouth and nose with a tissue when you cough or sneeze, or you can cough or sneeze into your sleeve. Throw used tissues in a lined trash can, and immediately wash your hands with soap and water for at least 20 seconds or use an alcohol-based hand rub.  Wash your [employer] your hands often and thoroughly with soap and water for at least 20 seconds. You can use an alcohol-based hand sanitizer if soap and water are not available and if your hands are not visibly dirty. Avoid touching your eyes, nose, and mouth with unwashed hands.   Prevention Steps for Caregivers and Household Members of Individuals Confirmed to have, or Being Evaluated for, COVID-19 Infection Being Cared for in the Home  If you live with, or provide care at home for, a person confirmed to have, or being evaluated for, COVID-19 infection please follow these guidelines to prevent infection:  Follow healthcare providers instructions Make sure that you understand and can help the patient follow any healthcare provider instructions for all care.  Provide for the patients basic needs  You should help the patient with basic needs in the home and provide support for getting groceries, prescriptions, and other personal needs.  Monitor the patients symptoms If they are getting sicker, call his or her medical provider and tell them that the patient has, or is  being evaluated for, COVID-19 infection. This will help the healthcare providers office take steps to keep other people from getting infected. Ask the healthcare provider to call the local or state health department.  Limit the number of people who have contact with the patient If possible, have only one caregiver for the patient. Other household members should stay in another home or place of residence. If this is not possible, they should stay in another room, or be separated from the patient as much as possible. Use a separate bathroom, if available. Restrict visitors who do not have an essential need to be in the home.  Keep older adults, very young children, and other sick people away from the patient Keep older adults, very young children, and those who have compromised immune systems or chronic health conditions away from the patient. This includes people with chronic heart, lung, or kidney conditions, diabetes, and cancer.  Ensure good ventilation Make sure that shared spaces in the home have good air flow, such as from an air conditioner or an opened window, weather permitting.  Wash your hands often Wash your hands often and thoroughly with soap and water for at least 20 seconds. You can use an alcohol based hand sanitizer if soap and water are not available and if your hands are not visibly dirty. Avoid touching your eyes, nose, and mouth with unwashed hands. Use disposable paper towels to dry your hands. If not available, use dedicated cloth towels and replace them when they become wet.  Wear a facemask and gloves Wear a disposable facemask at all times in the room and gloves when you touch or have contact with the patients blood, body fluids, and/or secretions or excretions, such as sweat, saliva, sputum, nasal mucus, vomit, urine, or feces.  Ensure the mask fits over your nose and mouth tightly, and do not touch it during use. Throw out disposable facemasks and gloves after  using them. Do not reuse. Wash your hands immediately after removing your facemask and gloves. If your personal clothing becomes contaminated, carefully remove clothing and launder. Wash your hands after handling contaminated clothing. Place all used disposable facemasks, gloves, and other waste in a lined container before disposing them with other household waste. Remove gloves and wash your hands immediately after handling these items.  Do not share dishes, glasses, or other household items with the patient Avoid sharing household items. You should not share dishes, drinking glasses, cups, eating utensils, towels, bedding, or other items with a patient who is confirmed to have, or being evaluated for, COVID-19 infection. After the person uses these items, you should wash them thoroughly with soap and water.  Wash laundry thoroughly Immediately remove and wash clothes or bedding that have blood, body fluids, and/or secretions or excretions, such as sweat, saliva, sputum, nasal mucus, vomit, urine, or feces, on them. Wear gloves when handling laundry from the patient. Read and follow directions on labels of laundry or clothing items and detergent. In general, wash and dry with the warmest temperatures recommended on the label.  Clean all areas the individual has used often Clean all touchable surfaces, such as counters, tabletops, doorknobs, bathroom fixtures, toilets, phones, keyboards, tablets, and bedside tables, every day. Also,  clean any surfaces that may have blood, body fluids, and/or secretions or excretions on them. Wear gloves when cleaning surfaces the patient has come in contact with. Use a diluted bleach solution (e.g., dilute bleach with 1 part bleach and 10 parts water) or a household disinfectant with a label that says EPA-registered for coronaviruses. To make a bleach solution at home, add 1 tablespoon of bleach to 1 quart (4 cups) of water. For a larger supply, add  cup of bleach  to 1 gallon (16 cups) of water. Read labels of cleaning products and follow recommendations provided on product labels. Labels contain instructions for safe and effective use of the cleaning product including precautions you should take when applying the product, such as wearing gloves or eye protection and making sure you have good ventilation during use of the product. Remove gloves and wash hands immediately after cleaning.  Monitor yourself for signs and symptoms of illness Caregivers and household members are considered close contacts, should monitor their health, and will be asked to limit movement outside of the home to the extent possible. Follow the monitoring steps for close contacts listed on the symptom monitoring form.   ? If you have additional questions, contact your local health department or call the epidemiologist on call at [phone] (available 24/7). ? This guidance is subject to change. For the most up-to-date guidance from [facility], please refer to their website: [email or web address][email or web address]

## 2018-05-24 NOTE — ED Notes (Signed)
Patient transported to X-ray 

## 2018-05-24 NOTE — ED Provider Notes (Signed)
[facility] Provider Note   CSN: [id number] Arrival date & time: 05/23/18  2350    History   Chief Complaint Chief Complaint  Patient presents with  . Sore Throat  . Cough    nonproductive    HPI Justin Novak is a 33 y.o. male.     Patient works for [employer] has had multiple sick contacts at work.  States 2 days ago he developed a scratchy throat and a hoarse voice.  Over the past 1 day he is developed a dry cough that is nonproductive.  Denies any fever.  No chest pain or shortness of breath.  No leg pain or leg swelling.  He is felt well at home but is concerned because he has small children.  He states he is had multiple sick contacts at work who have been coronavirus positive.  Denies any runny nose or sore throat.  No leg pain or leg swelling.  He came in tonight with the assistance of his wife.  The history is provided by the patient.  Sore Throat  Pertinent negatives include no chest pain, no abdominal pain, no headaches and no shortness of breath.  Cough  Associated symptoms: rhinorrhea and sore throat   Associated symptoms: no chest pain, no fever, no headaches, no myalgias, no rash and no shortness of breath     History reviewed. No pertinent past medical history.  There are no active problems to display for this patient.   History reviewed. No pertinent surgical history.      Home Medications    Prior to Admission medications   Medication Sig Start Date End Date Taking? Authorizing Provider  methocarbamol (ROBAXIN) 500 MG tablet Take 1 tablet (500 mg total) by mouth every 8 (eight) hours as needed. 09/29/17   Petrucelli, Samantha R, PA-C  naproxen (NAPROSYN) 500 MG tablet Take 1 tablet (500 mg total) by mouth 2 (two) times daily. 09/29/17   Petrucelli, Pleas KochSamantha R, PA-C    Family History History reviewed. No pertinent family history.  Social History Social History   Tobacco Use  . Smoking status: Never Smoker  .  Smokeless tobacco: Never Used  Substance Use Topics  . Alcohol use: No  . Drug use: Not Currently     Allergies   Patient has no known allergies.   Review of Systems Review of Systems  Constitutional: Positive for fatigue. Negative for activity change, appetite change and fever.  HENT: Positive for congestion, rhinorrhea and sore throat. Negative for trouble swallowing.   Respiratory: Positive for cough. Negative for chest tightness and shortness of breath.   Cardiovascular: Negative for chest pain.  Gastrointestinal: Negative for abdominal pain, nausea and vomiting.  Genitourinary: Negative for dysuria and hematuria.  Musculoskeletal: Negative for arthralgias, back pain and myalgias.  Skin: Negative for rash.  Neurological: Negative for dizziness, weakness and headaches.   all other systems are negative except as noted in the HPI and PMH.     Physical Exam Updated Vital Signs BP (!) 141/77   Pulse 65   Temp 99 F (37.2 C) (Oral)   Resp 16   Ht 6\' 1"  (1.854 m)   Wt 83.9 kg   SpO2 100%   BMI 24.41 kg/m   Physical Exam Vitals signs and nursing note reviewed.  Constitutional:      General: He is not in acute distress.    Appearance: He is well-developed. He is not ill-appearing or toxic-appearing.  HENT:     Head:  Normocephalic and atraumatic.     Right Ear: Tympanic membrane normal.     Left Ear: Tympanic membrane normal.     Mouth/Throat:     Mouth: Mucous membranes are moist.     Pharynx: No oropharyngeal exudate or posterior oropharyngeal erythema.     Comments: No asymmetry, no exudates Eyes:     Conjunctiva/sclera: Conjunctivae normal.     Pupils: Pupils are equal, round, and reactive to light.  Neck:     Musculoskeletal: Normal range of motion and neck supple.     Comments: No meningismus. Cardiovascular:     Rate and Rhythm: Normal rate and regular rhythm.     Heart sounds: Normal heart sounds. No murmur.  Pulmonary:     Effort: Pulmonary effort is  normal. No respiratory distress.     Breath sounds: Normal breath sounds.  Abdominal:     Palpations: Abdomen is soft.     Tenderness: There is no abdominal tenderness. There is no guarding or rebound.  Musculoskeletal: Normal range of motion.        General: No tenderness.  Skin:    General: Skin is warm.     Capillary Refill: Capillary refill takes less than 2 seconds.  Neurological:     General: No focal deficit present.     Mental Status: He is alert and oriented to person, place, and time. Mental status is at baseline.     Cranial Nerves: No cranial nerve deficit.     Motor: No abnormal muscle tone.     Coordination: Coordination normal.     Comments:  5/5 strength throughout. CN 2-12 intact.Equal grip strength.   Psychiatric:        Behavior: Behavior normal.      ED Treatments / Results  Labs (all labs ordered are listed, but only abnormal results are displayed) Labs Reviewed  GROUP A STREP BY PCR - Abnormal; Notable for the following components:      Result Value   Group A Strep by PCR DETECTED (*)    All other components within normal limits    EKG None  Radiology Dg Chest 2 View  Result Date: 05/24/2018 CLINICAL DATA:  Shortness of breath. EXAM: CHEST - 2 VIEW COMPARISON:  None. FINDINGS: The cardiomediastinal contours are normal. The lungs are clear. Pulmonary vasculature is normal. No consolidation, pleural effusion, or pneumothorax. No acute osseous abnormalities are seen. IMPRESSION: Unremarkable radiographs of the chest. Electronically Signed   By: Narda Rutherford M.D.   On: 05/24/2018 00:39    Procedures Procedures (including critical care time)  Medications Ordered in ED Medications  acetaminophen (TYLENOL) tablet 650 mg (650 mg Oral Given 05/24/18 0027)     Initial Impression / Assessment and Plan / ED Course  I have reviewed the triage vital signs and the nursing notes.  Pertinent labs & imaging results that were available during my care of the  patient were reviewed by me and considered in my medical decision making (see chart for details).       Patient with dry cough and scratchy throat.  Multiple sick contacts at home.  He appears well and is afebrile without chest pain or shortness of breath or hypoxia.  Rapid strep is positive.  Will treat with IM Bicillin.  Chest x-ray is negative.  Patient is well-appearing without hypoxia or tachypnea.  Lungs are clear.  Discussed possibility of coronavirus.  This seems less likely with his positive strep test.  We will still recommend quarantine until  symptom-free for 7 days.  Follow-up with PCP.  Return precautions discussed.  Ladarrious Vu was evaluated in Emergency Department on 05/24/2018 for the symptoms described in the history of present illness. He was evaluated in the context of the global COVID-19 pandemic, which necessitated consideration that the patient might be at risk for infection with the SARS-CoV-2 virus that causes COVID-19. Institutional protocols and algorithms that pertain to the evaluation of patients at risk for COVID-19 are in a state of rapid change based on information released by regulatory bodies including the CDC and federal and state organizations. These policies and algorithms were followed during the patient's care in the ED.  Final Clinical Impressions(s) / ED Diagnoses   Final diagnoses:  Strep pharyngitis  Viral URI with cough    ED Discharge Orders    None       Trenise Turay, Jeannett Senior, MD 05/24/18 [phone]

## 2018-08-04 ENCOUNTER — Telehealth: Payer: Self-pay | Admitting: Neurology

## 2018-08-04 NOTE — Telephone Encounter (Signed)
Pt gave consent for VV on the phone/ Pt understands that although there may be some limitations with this type of visit, we will take all precautions to reduce any security or privacy concerns.  Pt understands that this will be treated like an in office visit and we will file with pt's insurance, and there may be a patient responsible charge related to this service. °

## 2018-08-05 ENCOUNTER — Telehealth: Payer: Self-pay | Admitting: *Deleted

## 2018-08-05 ENCOUNTER — Encounter: Payer: Self-pay | Admitting: *Deleted

## 2018-08-05 NOTE — Telephone Encounter (Signed)
I attempted to reach the patient to update his chart prior to his virtual visit with Dr. Krista Blue.  He did not answer and I was unable to leave a message.  His chart has been updated, as much as possible, with the records received from Dr. Darien Ramus office.

## 2018-08-10 ENCOUNTER — Encounter: Payer: Self-pay | Admitting: Neurology

## 2018-08-10 ENCOUNTER — Telehealth (INDEPENDENT_AMBULATORY_CARE_PROVIDER_SITE_OTHER): Payer: 59 | Admitting: Neurology

## 2018-08-10 DIAGNOSIS — R419 Unspecified symptoms and signs involving cognitive functions and awareness: Secondary | ICD-10-CM

## 2018-08-10 HISTORY — DX: Unspecified symptoms and signs involving cognitive functions and awareness: R41.9

## 2018-08-10 NOTE — Progress Notes (Signed)
PATIENT: Justin Novak DOB: 1985-11-01  Virtual Visit via video  I connected with Joycie Peek on 08/10/18 at  by video and verified that I am speaking with the correct person using two identifiers.   I discussed the limitations, risks, security and privacy concerns of performing an evaluation and management service by video and the availability of in person appointments. I also discussed with the patient that there may be a patient responsible charge related to this service. The patient expressed understanding and agreed to proceed.  HISTORICAL  Justin Novak is a 33 year old male, seen in request by Justin Novak, Justin Novak for evaluation of blacking out spells  I have reviewed and summarized the referring note from the referring physician.  He has past medical history of ADHD, was treated with Adderall and Ritalin in the past, he has stopped the medication since eighth grade, Justin mom felt that he does not need it anymore.  In addition, complains of feeling tired, drowsiness while taking Ritalin, Adderall works better for him, while he was taking medication for ADHD, he rarely has blacking out spells,  Now he is married, works as a Librarian, academic for [employer], which requires heavy lifting, he has occasionally blacking out spells, about every couple months, all the spells are similar, is usually triggered by overworking, when he was overwhelmed with multitasking, he began to feel body weakness, blurry vision, as if he is going to pass out, if he can lie down on the ground, or kneeling down on the ground for a few seconds, Justin symptoms would improve, otherwise he felt heart racing fast, with transient loss of consciousness, there was no seizure activity described, no tongue biting, no bowel and bladder incontinence, Justin blacking out spells never happened in a sitting down or lying down position  Laboratory evaluations on July 22, 2018, normal CBC hemoglobin of 12.4, iron binding  capacity is 377, normal CMP creatinine of 1.02, normal liver functional test, TSH 1.33   Observations/Objective: I have reviewed problem lists, medications, allergies.  He is awake, alert, oriented to history taking, and casual conversation, moving 4 extremities without difficulty, ambulate without gait abnormalities.  Assessment and Plan: Transient passing out spells   Syncope, remote possibility of seizure  Proceed with MRI of the brain with without contrast, EEG,  Follow Up Instructions:  3 months with nurse practitioner Judson Roch    I discussed the assessment and treatment plan with the patient. The patient was provided an opportunity to ask questions and all were answered. The patient agreed with the plan and demonstrated an understanding of the instructions.   The patient was advised to call back or seek an in-person evaluation if the symptoms worsen or if the condition fails to improve as anticipated.  I provided 30 minutes of non-face-to-face time during this encounter.  REVIEW OF SYSTEMS: Full 14 system review of systems performed and notable only for as above All other review of systems were negative.  ALLERGIES: No Known Allergies  HOME MEDICATIONS: No current outpatient medications on file.   No current facility-administered medications for this visit.     PAST MEDICAL HISTORY: Past Medical History:  Diagnosis Date  . History of ADHD   . Syncope     PAST SURGICAL HISTORY: Past Surgical History:  Procedure Laterality Date  . NO PAST SURGERIES      FAMILY HISTORY: Family History  Problem Relation Age of Onset  . Asthma Mother   . ADD / ADHD Father   .  Healthy Sister   . Asthma Brother   . Healthy Sister     SOCIAL HISTORY:   Social History   Socioeconomic History  . Marital status: Married    Spouse name: Not on file  . Number of children: 3  . Years of education: some college  . Highest education level: Not on file  Occupational History  .  Not on file  Social Needs  . Financial resource strain: Not on file  . Food insecurity    Worry: Not on file    Inability: Not on file  . Transportation needs    Medical: Not on file    Non-medical: Not on file  Tobacco Use  . Smoking status: Never Smoker  . Smokeless tobacco: Never Used  Substance and Sexual Activity  . Alcohol use: No  . Drug use: Never  . Sexual activity: Not on file  Lifestyle  . Physical activity    Days per week: Not on file    Minutes per session: Not on file  . Stress: Not on file  Relationships  . Social Musicianconnections    Talks on phone: Not on file    Gets together: Not on file    Attends religious service: Not on file    Active member of club or organization: Not on file    Attends meetings of clubs or organizations: Not on file    Relationship status: Not on file  . Intimate partner violence    Fear of current or ex partner: Not on file    Emotionally abused: Not on file    Physically abused: Not on file    Forced sexual activity: Not on file  Other Topics Concern  . Not on file  Social History Narrative   No caffeine use.    Levert FeinsteinYijun Eleanore Junio, M.D. Ph.D.  [facility] [address], [address] [place], [place] [address] Ph: [phone] Fax: [phone]  CC: Farris HasMorrow, Aaron, MD

## 2018-08-11 ENCOUNTER — Telehealth: Payer: Self-pay | Admitting: Neurology

## 2018-08-11 NOTE — Telephone Encounter (Signed)
[employer] pending faxed notes 

## 2018-08-12 NOTE — Telephone Encounter (Signed)
unable to reach patient the vmail box has not been set up  [place]: [id number] (exp. 08/11/18 to 09/25/18)

## 2018-08-13 ENCOUNTER — Observation Stay (HOSPITAL_COMMUNITY)
Admission: EM | Admit: 2018-08-13 | Discharge: 2018-08-15 | Disposition: A | Discharge status: Home / Self Care | Payer: 59 | Attending: Internal Medicine | Admitting: Internal Medicine

## 2018-08-13 ENCOUNTER — Emergency Department (HOSPITAL_COMMUNITY): Payer: 59

## 2018-08-13 ENCOUNTER — Other Ambulatory Visit: Payer: Self-pay

## 2018-08-13 DIAGNOSIS — R030 Elevated blood-pressure reading, without diagnosis of hypertension: Secondary | ICD-10-CM | POA: Insufficient documentation

## 2018-08-13 DIAGNOSIS — R5383 Other fatigue: Secondary | ICD-10-CM | POA: Insufficient documentation

## 2018-08-13 DIAGNOSIS — I421 Obstructive hypertrophic cardiomyopathy: Secondary | ICD-10-CM | POA: Diagnosis present

## 2018-08-13 DIAGNOSIS — N179 Acute kidney failure, unspecified: Secondary | ICD-10-CM | POA: Insufficient documentation

## 2018-08-13 DIAGNOSIS — R55 Syncope and collapse: Secondary | ICD-10-CM | POA: Insufficient documentation

## 2018-08-13 DIAGNOSIS — R001 Bradycardia, unspecified: Secondary | ICD-10-CM | POA: Diagnosis not present

## 2018-08-13 DIAGNOSIS — F909 Attention-deficit hyperactivity disorder, unspecified type: Secondary | ICD-10-CM | POA: Insufficient documentation

## 2018-08-13 DIAGNOSIS — Z1159 Encounter for screening for other viral diseases: Secondary | ICD-10-CM | POA: Insufficient documentation

## 2018-08-13 HISTORY — DX: Other fatigue: R53.83

## 2018-08-13 HISTORY — DX: Syncope and collapse: R55

## 2018-08-13 LAB — COMPREHENSIVE METABOLIC PANEL
ALT: 22 U/L (ref 0–44)
AST: 25 U/L (ref 15–41)
Albumin: 4.5 g/dL (ref 3.5–5.0)
Alkaline Phosphatase: 50 U/L (ref 38–126)
Anion gap: 13 (ref 5–15)
BUN: 15 mg/dL (ref 6–20)
CO2: 20 mmol/L — ABNORMAL LOW (ref 22–32)
Calcium: 9.8 mg/dL (ref 8.9–10.3)
Chloride: 105 mmol/L (ref 98–111)
Creatinine, Ser: 1.3 mg/dL — ABNORMAL HIGH (ref 0.61–1.24)
GFR calc Af Amer: >60 mL/min (ref >60)
GFR calc non Af Amer: >60 mL/min (ref >60)
Glucose, Bld: 92 mg/dL (ref 70–99)
Potassium: 3.6 mmol/L (ref 3.5–5.1)
Sodium: 138 mmol/L (ref 135–145)
Total Bilirubin: 0.3 mg/dL (ref 0.3–1.2)
Total Protein: 7.8 g/dL (ref 6.5–8.1)

## 2018-08-13 LAB — CBC WITH DIFFERENTIAL/PLATELET
Abs Immature Granulocytes: 0.02 10*3/uL (ref 0.00–0.07)
Basophils Absolute: 0 10*3/uL (ref 0.0–0.1)
Basophils Relative: 0 %
Eosinophils Absolute: 0.1 10*3/uL (ref 0.0–0.5)
Eosinophils Relative: 1 %
HCT: 40.8 % (ref 39.0–52.0)
Hemoglobin: 12.7 g/dL — ABNORMAL LOW (ref 13.0–17.0)
Immature Granulocytes: 0 %
Lymphocytes Relative: 28 %
Lymphs Abs: 1.8 10*3/uL (ref 0.7–4.0)
MCH: 25.8 pg — ABNORMAL LOW (ref 26.0–34.0)
MCHC: 31.1 g/dL (ref 30.0–36.0)
MCV: 82.9 fL (ref 80.0–100.0)
Monocytes Absolute: 0.6 10*3/uL (ref 0.1–1.0)
Monocytes Relative: 9 %
Neutro Abs: 3.9 10*3/uL (ref 1.7–7.7)
Neutrophils Relative %: 62 %
Platelets: 236 10*3/uL (ref 150–400)
RBC: 4.92 MIL/uL (ref 4.22–5.81)
RDW: 13 % (ref 11.5–15.5)
WBC: 6.5 10*3/uL (ref 4.0–10.5)
nRBC: 0 % (ref 0.0–0.2)

## 2018-08-13 LAB — TROPONIN I (HIGH SENSITIVITY): Troponin I (High Sensitivity): <2 ng/L (ref <18)

## 2018-08-13 NOTE — ED Triage Notes (Signed)
Patient arrived with EMS from work reports feeling overwhelmed / stressed at work this evening and started cramping at arms /legs and pain across his chest with mild SOB/hyperventilating . History of ADHD and Panic attack .

## 2018-08-14 ENCOUNTER — Observation Stay (HOSPITAL_COMMUNITY): Payer: 59

## 2018-08-14 DIAGNOSIS — R001 Bradycardia, unspecified: Secondary | ICD-10-CM | POA: Diagnosis not present

## 2018-08-14 DIAGNOSIS — R55 Syncope and collapse: Secondary | ICD-10-CM

## 2018-08-14 DIAGNOSIS — I421 Obstructive hypertrophic cardiomyopathy: Secondary | ICD-10-CM

## 2018-08-14 DIAGNOSIS — R030 Elevated blood-pressure reading, without diagnosis of hypertension: Secondary | ICD-10-CM | POA: Diagnosis present

## 2018-08-14 LAB — BASIC METABOLIC PANEL
Anion gap: 7 (ref 5–15)
BUN: 12 mg/dL (ref 6–20)
CO2: 26 mmol/L (ref 22–32)
Calcium: 9.2 mg/dL (ref 8.9–10.3)
Chloride: 107 mmol/L (ref 98–111)
Creatinine, Ser: 0.94 mg/dL (ref 0.61–1.24)
GFR calc Af Amer: >60 mL/min (ref >60)
GFR calc non Af Amer: >60 mL/min (ref >60)
Glucose, Bld: 99 mg/dL (ref 70–99)
Potassium: 3.5 mmol/L (ref 3.5–5.1)
Sodium: 140 mmol/L (ref 135–145)

## 2018-08-14 LAB — CBC
HCT: 39.4 % (ref 39.0–52.0)
Hemoglobin: 12.5 g/dL — ABNORMAL LOW (ref 13.0–17.0)
MCH: 26.3 pg (ref 26.0–34.0)
MCHC: 31.7 g/dL (ref 30.0–36.0)
MCV: 82.8 fL (ref 80.0–100.0)
Platelets: 215 10*3/uL (ref 150–400)
RBC: 4.76 MIL/uL (ref 4.22–5.81)
RDW: 13.2 % (ref 11.5–15.5)
WBC: 5.7 10*3/uL (ref 4.0–10.5)
nRBC: 0 % (ref 0.0–0.2)

## 2018-08-14 LAB — HIV ANTIBODY (ROUTINE TESTING W REFLEX): HIV Screen 4th Generation wRfx: NONREACTIVE

## 2018-08-14 LAB — D-DIMER, QUANTITATIVE (NOT AT ARMC): D-Dimer, Quant: <0.27 ug/mL-FEU (ref 0.00–0.50)

## 2018-08-14 LAB — CK: Total CK: 347 U/L (ref 49–397)

## 2018-08-14 LAB — TROPONIN I (HIGH SENSITIVITY): Troponin I (High Sensitivity): <2 ng/L (ref <18)

## 2018-08-14 LAB — GLUCOSE, CAPILLARY: Glucose-Capillary: 92 mg/dL (ref 70–99)

## 2018-08-14 MED ORDER — ACETAMINOPHEN 650 MG RE SUPP: 650.0000 mg | Freq: Four times a day (QID) | RECTAL | Status: DC | PRN

## 2018-08-14 MED ORDER — ACETAMINOPHEN 325 MG PO TABS: 650.0000 mg | ORAL_TABLET | Freq: Four times a day (QID) | ORAL | Status: DC | PRN

## 2018-08-14 MED ORDER — ONDANSETRON HCL 4 MG PO TABS: 4.0000 mg | ORAL_TABLET | Freq: Four times a day (QID) | ORAL | Status: DC | PRN

## 2018-08-14 MED ORDER — ONDANSETRON HCL 4 MG/2ML IJ SOLN: 4.0000 mg | Freq: Four times a day (QID) | INTRAMUSCULAR | Status: DC | PRN

## 2018-08-14 MED ORDER — ENOXAPARIN SODIUM 40 MG/0.4ML ~~LOC~~ SOLN
40.0000 mg | SUBCUTANEOUS | Status: DC
  Filled 2018-08-14 (×2): qty 0.4

## 2018-08-14 MED ORDER — SODIUM CHLORIDE 0.9 % IV SOLN: INTRAVENOUS | Status: DC

## 2018-08-14 MED ORDER — SODIUM CHLORIDE 0.9% FLUSH: 3.0000 mL | Freq: Two times a day (BID) | INTRAVENOUS | Status: DC

## 2018-08-14 MED ORDER — GADOBUTROL 1 MMOL/ML IV SOLN
8.3000 mL | Freq: Once | INTRAVENOUS | Status: AC | PRN
  Administered 2018-08-14: 8.3 mL via INTRAVENOUS

## 2018-08-14 NOTE — Consult Note (Addendum)
Cardiology Consultation:   Patient ID: Justin Novak MRN: [id number]; DOB: 1985/07/17  Admit date: 08/13/2018 Date of Consult: 08/14/2018  Primary Care Provider: Farris HasMorrow, Aaron, MD Primary Cardiologist: New patient/ Dr Delton SeeNelson   Patient Profile:   Justin OaksJustin Patchell is a 33 y.o. male with a hx of ADHD who is being seen today for the evaluation of recurrent syncope and bradycardia at the request of Dr Levert FeinsteinYijun Yan.  History of Present Illness:   Justin Novak is a [age] year old male with h/o ADHD, was treated with Adderall and Ritalin in the past, he has stopped the medication since eighth grade, his mom felt that he does not need it anymore.  In addition, complains of feeling tired, drowsiness while taking Ritalin, Adderall works better for him, while he was taking medication for ADHD, he rarely has blacking out spells. 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[employer], which requires heavy lifting, he has occasionally blacking out spells, about every couple months, all the spells are similar, is usually triggered by overworking, when he was overwhelmed with multitasking, he began to feel body weakness, blurry vision, as if he is going to pass out, if he can lie down on the ground, or kneeling down on the ground for a few seconds, his symptoms would improve, otherwise he felt heart racing fast, with transient loss of consciousness, there was no seizure activity described, no tongue biting, no bowel and bladder incontinence, his blacking out spells never happened in a sitting down or lying down position.  The patient states that he has been having syncopal episodes since age of 33, always when he overworks himself, however he was doing high school sports including wrestling and never passed out during exertion.  He passes out usually every other months currently working at [employer] carrying heavy boxes and syncopal episodes always when he feels tired, he knows when he is going to pass out, syncopal spell is always preceded by  palpitations, chest tightness and blurry vision.  No syncopal episodes at rest.  No syncopal episodes while driving.  Yesterday he had strenuous day carrying a lot of heavy boxes felt tired, dizzy, tried to sit down, eventually felt chest tightness palpitations numbness in his arms and legs and eventually passed out. He has no family history of sudden cardiac death, coronary artery disease or arrhythmias.  Laboratory evaluations on July 22, 2018, normal CBC hemoglobin of 12.4, iron binding capacity is 377, normal CMP creatinine of 1.02, normal liver functional test, TSH 1.33 Neurology seen the patient and ordered EEG that is pending and brain MRI.  His labs are normal, CT head is normal, CXR is normal, ECG shows SR, significant LVH.   Heart Pathway Score:     Past Medical History:  Diagnosis Date  . Fatigue 08/13/2018  . History of ADHD   . Loss of alertness 08/10/2018  . Syncopal episodes 08/13/2018  . Syncope     Past Surgical History:  Procedure Laterality Date  . NO PAST SURGERIES       Home Medications:  Prior to Admission medications   Not on File   Inpatient Medications: Scheduled Meds: . enoxaparin (LOVENOX) injection  40 mg Subcutaneous Q24H  . sodium chloride flush  3 mL Intravenous Q12H   Continuous Infusions: . sodium chloride     PRN Meds: acetaminophen **OR** acetaminophen, ondansetron **OR** ondansetron (ZOFRAN) IV  Allergies:   No Known Allergies  Social History:   Social History   Socioeconomic History  . Marital status: Married  Spouse name: Not on file  . Number of children: 3  . Years of education: some college  . Highest education level: Not on file  Occupational History  . Not on file  Social Needs  . Financial resource strain: Not on file  . Food insecurity    Worry: Not on file    Inability: Not on file  . Transportation needs    Medical: Not on file    Non-medical: Not on file  Tobacco Use  . Smoking status: Never Smoker  . Smokeless  tobacco: Never Used  Substance and Sexual Activity  . Alcohol use: No  . Drug use: Never  . Sexual activity: Not on file  Lifestyle  . Physical activity    Days per week: Not on file    Minutes per session: Not on file  . Stress: Not on file  Relationships  . Social Herbalist on phone: Not on file    Gets together: Not on file    Attends religious service: Not on file    Active member of club or organization: Not on file    Attends meetings of clubs or organizations: Not on file    Relationship status: Not on file  . Intimate partner violence    Fear of current or ex partner: Not on file    Emotionally abused: Not on file    Physically abused: Not on file    Forced sexual activity: Not on file  Other Topics Concern  . Not on file  Social History Narrative   No caffeine use.    Family History:    Family History  Problem Relation Age of Onset  . Asthma Mother   . ADD / ADHD Father   . Healthy Sister   . Asthma Brother   . Healthy Sister      ROS:  Please see the history of present illness.  All other ROS reviewed and negative.     Physical Exam/Data:   Vitals:   08/14/18 0300 08/14/18 0353 08/14/18 0415 08/14/18 0746  BP: 128/78 131/72 116/82 115/66  Pulse: (!) 58 63 (!) 55 (!) 51  Resp: 13 18 18 17   Temp:   98.6 F (37 C) 98.1 F (36.7 C)  TempSrc:   Oral Oral  SpO2: 100% 99% 100% 100%    Intake/Output Summary (Last 24 hours) at 08/14/2018 1205 Last data filed at 08/14/2018 1740 Gross per 24 hour  Intake 360 ml  Output -  Net 360 ml   Last 3 Weights 05/24/2018 09/29/2017 06/14/2016  Weight (lbs) 185 lb 178 lb 9.2 oz 178 lb  Weight (kg) 83.915 kg 81 kg 80.74 kg     There is no height or weight on file to calculate BMI.  General:  Well nourished, well developed, in no acute distress HEENT: normal Lymph: no adenopathy Neck: no JVD Endocrine:  No thryomegaly Vascular: No carotid bruits; FA pulses 2+ bilaterally without bruits  Cardiac:   normal S1, S2; RRR; no murmur  Lungs:  clear to auscultation bilaterally, no wheezing, rhonchi or rales  Abd: soft, nontender, no hepatomegaly  Ext: no edema Musculoskeletal:  No deformities, BUE and BLE strength normal and equal Skin: warm and dry  Neuro:  CNs 2-12 intact, no focal abnormalities noted Psych:  Normal affect   EKG:  The EKG was personally reviewed and demonstrates:  SR, LVH, no repolarization abnormalities Telemetry:  Telemetry was personally reviewed and demonstrates:  SB to SR  Relevant  CV Studies: TTE is pending  Laboratory Data:  High Sensitivity Troponin:   Recent Labs  Lab 08/13/18 2221 08/14/18 0151  TROPONINIHS <2 <2     Cardiac EnzymesNo results for input(s): TROPONINI in the last 168 hours. No results for input(s): TROPIPOC in the last 168 hours.  Chemistry Recent Labs  Lab 08/13/18 2221 08/14/18 0601  NA 138 140  K 3.6 3.5  CL 105 107  CO2 20* 26  GLUCOSE 92 99  BUN 15 12  CREATININE 1.30* 0.94  CALCIUM 9.8 9.2  GFRNONAA >60 >60  GFRAA >60 >60  ANIONGAP 13 7    Recent Labs  Lab 08/13/18 2221  PROT 7.8  ALBUMIN 4.5  AST 25  ALT 22  ALKPHOS 50  BILITOT 0.3   Hematology Recent Labs  Lab 08/13/18 2221 08/14/18 0601  WBC 6.5 5.7  RBC 4.92 4.76  HGB 12.7* 12.5*  HCT 40.8 39.4  MCV 82.9 82.8  MCH 25.8* 26.3  MCHC 31.1 31.7  RDW 13.0 13.2  PLT 236 215   BNPNo results for input(s): BNP, PROBNP in the last 168 hours.  DDimer  Recent Labs  Lab 08/14/18 0151  DDIMER <0.27     Radiology/Studies:  Dg Chest 2 View  Result Date: 08/13/2018 CLINICAL DATA:  Chest pain EXAM: CHEST - 2 VIEW COMPARISON:  May 24, 2018 FINDINGS: The heart size and mediastinal contours are within normal limits. Both lungs are clear. The visualized skeletal structures are unremarkable. IMPRESSION: No active cardiopulmonary disease. Electronically Signed   By: Katherine Mantlehristopher  Green M.D.   On: 08/13/2018 23:01    Assessment and Plan:   1. Recurrent  syncope 1. In the settings of LVH on ECG 2. I have personally reviewed his echocardiogram that shows normal left ventricular size function and thickness, also normal diastolic function, no significant valvular abnormalities basically normal echocardiogram.  He does not have elevated blood pressure. 3.  Telemetry shows sinus bradycardia down to low 50s however no significant pauses or AV block, his EKG does not show any QT prolongation. 4.  If his brain MRI and EEG, normal we would recommend outpatient 30 days ZIO patch monitor, if he does not have any episodes or any arrhythmias on the monitor during that time we would recommend placement of a Linq monitor and referral to EP.  For questions or updates, please contact [facility] Please consult [email or web address] for contact info under   Signed, Tobias AlexanderKatarina Talynn Lebon, MD  08/14/2018 12:05 PM

## 2018-08-14 NOTE — ED Provider Notes (Signed)
[facility] EMERGENCY DEPARTMENT Provider Note   CSN: [id number] Arrival date & time: 08/13/18  2207     History   Chief Complaint Chief Complaint  Patient presents with  . Chest Pain / Panic Attack    HPI Justin Novak is a 33 y.o. male.     Patient here from his job at [employer] with syncopal episode.  States he was "working too hard at work while moving some boxes".  He began to feel tight in his chest with racing heartbeat and chest tightness and shortness of breath.  He started having cramping all over and generalized weakness.  He states he did have a syncopal episode and was caught by his supervisor did not hit his head.  No tongue biting or incontinence.  He still feels tightness chest some mild headache.  No neck or back pain.  No focal weakness, numbness or tingling.  He states he has had frequent syncopal episodes while at work approximately 8 in the past 1 year.  He recently reestablished care with a PCP and is going to be referred to her neurologist and cardiologist.  He states he believes his syncopal episodes are due to him not being on his ADHD medicine any longer.  He takes no prescribed medications now.  He denies any drug use.  He complains of tightness in his chest that has been constant.  States he generally feels like he gets syncopal when he is at work or exerting himself. Occasionally he has to go down to one knee and doesn't always lose consciousness  The history is provided by the patient and the EMS personnel.    Past Medical History:  Diagnosis Date  . Fatigue 08/13/2018  . History of ADHD   . Loss of alertness 08/10/2018  . Syncopal episodes 08/13/2018  . Syncope     Patient Active Problem List   Diagnosis Date Noted  . Fatigue 08/13/2018  . Syncopal episodes 08/13/2018  . Loss of alertness 08/10/2018    Past Surgical History:  Procedure Laterality Date  . NO PAST SURGERIES          Home Medications    Prior to Admission  medications   Not on File    Family History Family History  Problem Relation Age of Onset  . Asthma Mother   . ADD / ADHD Father   . Healthy Sister   . Asthma Brother   . Healthy Sister     Social History Social History   Tobacco Use  . Smoking status: Never Smoker  . Smokeless tobacco: Never Used  Substance Use Topics  . Alcohol use: No  . Drug use: Never     Allergies   Patient has no known allergies.   Review of Systems Review of Systems  Constitutional: Positive for fatigue. Negative for activity change, appetite change and fever.  HENT: Negative for congestion and rhinorrhea.   Eyes: Negative for visual disturbance.  Respiratory: Positive for chest tightness and shortness of breath.   Cardiovascular: Positive for chest pain. Negative for leg swelling.  Gastrointestinal: Negative for abdominal pain, nausea and vomiting.  Genitourinary: Negative for dysuria and hematuria.  Musculoskeletal: Negative for arthralgias and myalgias.  Skin: Negative for rash.  Neurological: Positive for syncope. Negative for dizziness and headaches.    all other systems are negative except as noted in the HPI and PMH.    Physical Exam Updated Vital Signs BP (!) 152/83 (BP Location: Right Arm)   Pulse 89  Temp 98.3 F (36.8 C) (Oral)   Resp 18   SpO2 100%   Physical Exam Vitals signs and nursing note reviewed.  Constitutional:      General: He is not in acute distress.    Appearance: Normal appearance. He is well-developed. He is not ill-appearing.  HENT:     Head: Normocephalic and atraumatic.     Mouth/Throat:     Pharynx: No oropharyngeal exudate.  Eyes:     Conjunctiva/sclera: Conjunctivae normal.     Pupils: Pupils are equal, round, and reactive to light.  Neck:     Musculoskeletal: Normal range of motion and neck supple.     Comments: No meningismus. Cardiovascular:     Rate and Rhythm: Normal rate and regular rhythm.     Heart sounds: Normal heart sounds. No  murmur.  Pulmonary:     Effort: Pulmonary effort is normal. No respiratory distress.     Breath sounds: Normal breath sounds.  Chest:     Chest wall: No tenderness.  Abdominal:     Palpations: Abdomen is soft.     Tenderness: There is no abdominal tenderness. There is no guarding or rebound.  Musculoskeletal: Normal range of motion.        General: No tenderness.  Skin:    General: Skin is warm.  Neurological:     Mental Status: He is alert and oriented to person, place, and time.     Cranial Nerves: No cranial nerve deficit.     Motor: No abnormal muscle tone.     Coordination: Coordination normal.     Comments: No ataxia on finger to nose bilaterally. No pronator drift. 5/5 strength throughout. CN 2-12 intact.Equal grip strength. Sensation intact.   Psychiatric:        Behavior: Behavior normal.      ED Treatments / Results  Labs (all labs ordered are listed, but only abnormal results are displayed) Labs Reviewed  CBC WITH DIFFERENTIAL/PLATELET - Abnormal; Notable for the following components:      Result Value   Hemoglobin 12.7 (*)    MCH 25.8 (*)    All other components within normal limits  COMPREHENSIVE METABOLIC PANEL - Abnormal; Notable for the following components:   CO2 20 (*)    Creatinine, Ser 1.30 (*)    All other components within normal limits  CBC - Abnormal; Notable for the following components:   Hemoglobin 12.5 (*)    All other components within normal limits  NOVEL CORONAVIRUS, NAA (HOSPITAL ORDER, SEND-OUT TO REF LAB)  TROPONIN I (HIGH SENSITIVITY)  TROPONIN I (HIGH SENSITIVITY)  D-DIMER, QUANTITATIVE (NOT AT [facility])  CK  BASIC METABOLIC PANEL  HIV ANTIBODY (ROUTINE TESTING W REFLEX)    EKG EKG Interpretation  Date/Time:  Friday August 13 2018 22:19:27 EDT Ventricular Rate:  84 PR Interval:  130 QRS Duration: 92 QT Interval:  378 QTC Calculation: 446 R Axis:   87 Text Interpretation:  Normal sinus rhythm Possible Left atrial enlargement  Incomplete right bundle branch block Left ventricular hypertrophy Abnormal ECG No previous ECGs available Confirmed by Glynn Octaveancour, Glennis Montenegro [phone]([id number]) on 08/14/2018 1:46:46 AM   Radiology Dg Chest 2 View  Result Date: 08/13/2018 CLINICAL DATA:  Chest pain EXAM: CHEST - 2 VIEW COMPARISON:  May 24, 2018 FINDINGS: The heart size and mediastinal contours are within normal limits. Both lungs are clear. The visualized skeletal structures are unremarkable. IMPRESSION: No active cardiopulmonary disease. Electronically Signed   By: Katherine Mantlehristopher  Green M.D.   On:  08/13/2018 23:01    Procedures Procedures (including critical care time)  Medications Ordered in ED Medications - No data to display   Initial Impression / Assessment and Plan / ED Course  I have reviewed the triage vital signs and the nursing notes.  Pertinent labs & imaging results that were available during my care of the patient were reviewed by me and considered in my medical decision making (see chart for details).       Patient here with chest tightness, shortness of breath, cramping all over and syncope while working a physical job.  Did lose consciousness. History of recurrent episodes.  His EKG is concerning for hypertrophic cardiomyopathy.  He has no murmur on exam.  dw Dr. Santiago Gladarncelli of cardiology. He agrees with hospitalist admission given concerning history of recurrent syncope and EKG findings.   EKG with LVH, no apparent Brugada but incomplete RBBB.  Labs reassuring. Troponin and d-dimer negative.   Plan admission for echo and cardiology consult as well as monitoring for possible arrhythmias. D/w Dr. Mikeal HawthorneGarba. Final Clinical Impressions(s) / ED Diagnoses   Final diagnoses:  HOCM (hypertrophic obstructive cardiomyopathy) (HCC)  Syncope, unspecified syncope type    ED Discharge Orders    None       Amiley Shishido, Jeannett SeniorStephen, MD 08/14/18 [phone]

## 2018-08-14 NOTE — Progress Notes (Signed)
EEG complete - results pending 

## 2018-08-14 NOTE — ED Notes (Signed)
ED TO INPATIENT HANDOFF REPORT  ED Nurse Name and Phone #:  Tresa EndoKelly [phone]  S Name/Age/Gender Justin Novak 33 y.o. male Room/Bed: 034C/034C  Code Status   Code Status: Not on file  Home/SNF/Other Home Patient oriented to: self, place, time and situation Is this baseline? Yes   Triage Complete: Triage complete  Chief Complaint Panic attack  Triage Note Patient arrived with EMS from work reports feeling overwhelmed / stressed at work this evening and started cramping at arms /legs and pain across his chest with mild SOB/hyperventilating . History of ADHD and Panic attack .    Allergies No Known Allergies  Level of Care/Admitting Diagnosis ED Disposition    ED Disposition Condition Comment   Admit  Hospital Area: [facility] [[id number]]  Level of Care: Telemetry Cardiac [103]  I expect the patient will be discharged within 24 hours: Yes  LOW acuity---Tx typically complete <24 hrs---ACUTE conditions typically can be evaluated <24 hours---LABS likely to return to acceptable levels <24 hours---IS near functional baseline---EXPECTED to return to current living arrangement---NOT newly hypoxic: Meets criteria for 5C-Observation unit  Covid Evaluation: N/A  Diagnosis: Syncope and collapse [780.2.ICD-9-CM]  Admitting Physician: Rometta EmeryGARBA, MOHAMMAD L [[id number]]  Attending Physician: Rometta EmeryGARBA, MOHAMMAD L [[id number]]  PT Class (Do Not Modify): Observation [104]  PT Acc Code (Do Not Modify): Observation [10022]       B Medical/Surgery History Past Medical History:  Diagnosis Date  . Fatigue 08/13/2018  . History of ADHD   . Loss of alertness 08/10/2018  . Syncopal episodes 08/13/2018  . Syncope    Past Surgical History:  Procedure Laterality Date  . NO PAST SURGERIES       A IV Location/Drains/Wounds Patient Lines/Drains/Airways Status   Active Line/Drains/Airways    Name:   Placement date:   Placement time:   Site:   Days:   Peripheral IV 08/14/18 Right Antecubital    08/14/18    0200    Antecubital   less than 1          Intake/Output Last 24 hours No intake or output data in the 24 hours ending 08/14/18 0311  Labs/Imaging Results for orders placed or performed during the hospital encounter of 08/13/18 (from the past 48 hour(s))  CBC with Differential     Status: Abnormal   Collection Time: 08/13/18 10:21 PM  Result Value Ref Range   WBC 6.5 4.0 - 10.5 K/uL   RBC 4.92 4.22 - 5.81 MIL/uL   Hemoglobin 12.7 (L) 13.0 - 17.0 g/dL   HCT 16.140.8 09.639.0 - 04.552.0 %   MCV 82.9 80.0 - 100.0 fL   MCH 25.8 (L) 26.0 - 34.0 pg   MCHC 31.1 30.0 - 36.0 g/dL   RDW 40.913.0 81.111.5 - 91.415.5 %   Platelets 236 150 - 400 K/uL   nRBC 0.0 0.0 - 0.2 %   Neutrophils Relative % 62 %   Neutro Abs 3.9 1.7 - 7.7 K/uL   Lymphocytes Relative 28 %   Lymphs Abs 1.8 0.7 - 4.0 K/uL   Monocytes Relative 9 %   Monocytes Absolute 0.6 0.1 - 1.0 K/uL   Eosinophils Relative 1 %   Eosinophils Absolute 0.1 0.0 - 0.5 K/uL   Basophils Relative 0 %   Basophils Absolute 0.0 0.0 - 0.1 K/uL   Immature Granulocytes 0 %   Abs Immature Granulocytes 0.02 0.00 - 0.07 K/uL    Comment: Performed at [facility], [address] [place], [place] [address]  Comprehensive metabolic panel     Status: Abnormal   Collection Time: 08/13/18 10:21 PM  Result Value Ref Range   Sodium 138 135 - 145 mmol/L   Potassium 3.6 3.5 - 5.1 mmol/L   Chloride 105 98 - 111 mmol/L   CO2 20 (L) 22 - 32 mmol/L   Glucose, Bld 92 70 - 99 mg/dL   BUN 15 6 - 20 mg/dL   Creatinine, Ser 1.30 (H) 0.61 - 1.24 mg/dL   Calcium 9.8 8.9 - 10.3 mg/dL   Total Protein 7.8 6.5 - 8.1 g/dL   Albumin 4.5 3.5 - 5.0 g/dL   AST 25 15 - 41 U/L   ALT 22 0 - 44 U/L   Alkaline Phosphatase 50 38 - 126 U/L   Total Bilirubin 0.3 0.3 - 1.2 mg/dL   GFR calc non Af Amer >60 >60 mL/min   GFR calc Af Amer >60 >60 mL/min   Anion gap 13 5 - 15    Comment: Performed at [facility], [address] [place], [place] [address]  Troponin I (High  Sensitivity)     Status: None   Collection Time: 08/13/18 10:21 PM  Result Value Ref Range   Troponin I (High Sensitivity) <2 <18 ng/L    Comment: Performed at [facility] [address] [place], [place] [address]  Troponin I (High Sensitivity)     Status: None   Collection Time: 08/14/18  1:51 AM  Result Value Ref Range   Troponin I (High Sensitivity) <2 <18 ng/L    Comment: Performed at [facility] [address] [place], [place] [address]  D-dimer, quantitative (not at [facility])     Status: None   Collection Time: 08/14/18  1:51 AM  Result Value Ref Range   D-Dimer, Quant <0.27 0.00 - 0.50 ug/mL-FEU    Comment: (NOTE) At the manufacturer cut-off of 0.50 ug/mL FEU, this assay has been documented to exclude PE with a sensitivity and negative predictive value of 97 to 99%.  At this time, this assay has not been approved by the FDA to exclude DVT/VTE. Results should be correlated with clinical presentation. Performed at [facility], [place] [address], [place], [place] [address]   CK     Status: None   Collection Time: 08/14/18  1:51 AM  Result Value Ref Range   Total CK 347 49 - 397 U/L    Comment: Performed at [facility], [place] [address], [place], [place] [address]   Dg Chest 2 View  Result Date: 08/13/2018 CLINICAL DATA:  Chest pain EXAM: CHEST - 2 VIEW COMPARISON:  May 24, 2018 FINDINGS: The heart size and mediastinal contours are within normal limits. Both lungs are clear. The visualized skeletal structures are unremarkable. IMPRESSION: No active cardiopulmonary disease. Electronically Signed   By: Constance Holster M.D.   On: 08/13/2018 23:01    Pending Labs Unresulted Labs (From admission, onward)    Start     Ordered   08/14/18 0302  Novel Coronavirus,NAA,(SEND-OUT TO REF LAB - TAT 24-48 hrs); Hosp Order  (Asymptomatic Patients Labs)  Once,   STAT    Question:  Rule Out  Answer:  Yes   08/14/18 0301          Vitals/Pain Today's Vitals    08/14/18 0145 08/14/18 0201 08/14/18 0245 08/14/18 0300  BP: 127/77  118/70 128/78  Pulse:   (!) 55 (!) 58  Resp: 10  13  13  Temp:      TempSrc:      SpO2:   100% 100%  PainSc:  5       Isolation Precautions No active isolations  Medications Medications - No data to display  Mobility walks Low fall risk   Focused Assessments    R Recommendations: See Admitting Provider Note  Report given to:   Additional Notes:

## 2018-08-14 NOTE — H&P (Signed)
History and Physical   Justin Novak OAC:[id number] DOB: August 27, 1985 DOA: 08/13/2018  Referring MD/NP/PA: Dr. Wyvonnia Dusky  PCP: London Pepper, MD   Outpatient Specialists: None  Patient coming from: Home  Chief Complaint: Passing out  HPI: Justin Novak is a 33 y.o. male with medical history significant of ADHD who has had recurrent episodes of passing out or near passing out for the last couple of years.  Patient has had episodes like once a month that lasted just a few seconds when he may have to choose go down to his knee.  He has been pretty active as a child and play sports.  Never had any problem until recently.  Denied any sudden cardiac death in his family.  Patient came into the ER today after a more prolonged syncopal episode.  He has had a number of episodes this week but today was worse.  He always thought he was having panic attacks.  His episodes always come when he is busy and exerted himself.  Today he was moving some boxes when it happened.  He has some chest pain with it mainly tightness and then his heart started racing.  He felt crampy all over and generally weak as well as headache.  Episodes usually happen while he is at work.  He was seen recently by PCP and was supposed to see a neurologist and possibly cardiologist but has not established care yet.  Patient is not taking any medication including his ADHD medicine.  In the ER findings include bradycardia but abnormal EKG suspicious for hypertrophic cardiomyopathy.  Patient is being admitted to the hospital for work-up.  ED Course: Temperature is 98.6 blood pressure 152/83 pulse 89 respiratory rate of 18 oxygen sat 99% on room air.  White count 6.5 hemoglobin 12.7 and platelets 236.  Sodium 138 potassium 3.6 chloride 105 and CO2 20.  BUN is 59 creatinine 1.30 calcium 9.8.  CK 347 and troponin is negative.  Chest x-ray showed no acute finding.  EKG showed left ventricular hypertrophy with incomplete right bundle branch block  possible left atrial enlargement.  Patient suspected to have hypertrophic cardiomyopathy and is being admitted to the hospital for assessment and treatment.  Review of Systems: As per HPI otherwise 10 point review of systems negative.    Past Medical History:  Diagnosis Date  . Fatigue 08/13/2018  . History of ADHD   . Loss of alertness 08/10/2018  . Syncopal episodes 08/13/2018  . Syncope     Past Surgical History:  Procedure Laterality Date  . NO PAST SURGERIES       reports that he has never smoked. He has never used smokeless tobacco. He reports that he does not drink alcohol or use drugs.  No Known Allergies  Family History  Problem Relation Age of Onset  . Asthma Mother   . ADD / ADHD Father   . Healthy Sister   . Asthma Brother   . Healthy Sister      Prior to Admission medications   Not on File    Physical Exam: Vitals:   08/13/18 2231 08/14/18 0145 08/14/18 0245 08/14/18 0300  BP: (!) 152/83 127/77 118/70 128/78  Pulse: 89  (!) 55 (!) 58  Resp: 18 10 13 13   Temp: 98.3 F (36.8 C)     TempSrc: Oral     SpO2: 100%  100% 100%      Constitutional: NAD, calm, comfortable Vitals:   08/13/18 2231 08/14/18 0145 08/14/18 0245 08/14/18 0300  BP: Marland Kitchen)  152/83 127/77 118/70 128/78  Pulse: 89  (!) 55 (!) 58  Resp: 18 10 13 13   Temp: 98.3 F (36.8 C)     TempSrc: Oral     SpO2: 100%  100% 100%   Eyes: PERRL, lids and conjunctivae normal ENMT: Mucous membranes are moist. Posterior pharynx clear of any exudate or lesions.Normal dentition.  Neck: normal, supple, no masses, no thyromegaly Respiratory: clear to auscultation bilaterally, no wheezing, no crackles. Normal respiratory effort. No accessory muscle use.  Cardiovascular: Regular rate and rhythm, no murmurs / rubs / gallops. No extremity edema. 2+ pedal pulses. No carotid bruits.  Abdomen: no tenderness, no masses palpated. No hepatosplenomegaly. Bowel sounds positive.  Musculoskeletal: no clubbing /  cyanosis. No joint deformity upper and lower extremities. Good ROM, no contractures. Normal muscle tone.  Skin: no rashes, lesions, ulcers. No induration Neurologic: CN 2-12 grossly intact. Sensation intact, DTR normal. Strength 5/5 in all 4.  Psychiatric: Normal judgment and insight. Alert and oriented x 3. Normal mood.     Labs on Admission: I have personally reviewed following labs and imaging studies  CBC: Recent Labs  Lab 08/13/18 2221  WBC 6.5  NEUTROABS 3.9  HGB 12.7*  HCT 40.8  MCV 82.9  PLT 236   Basic Metabolic Panel: Recent Labs  Lab 08/13/18 2221  NA 138  K 3.6  CL 105  CO2 20*  GLUCOSE 92  BUN 15  CREATININE 1.30*  CALCIUM 9.8   GFR: CrCl cannot be calculated (Unknown ideal weight.). Liver Function Tests: Recent Labs  Lab 08/13/18 2221  AST 25  ALT 22  ALKPHOS 50  BILITOT 0.3  PROT 7.8  ALBUMIN 4.5   No results for input(s): LIPASE, AMYLASE in the last 168 hours. No results for input(s): AMMONIA in the last 168 hours. Coagulation Profile: No results for input(s): INR, PROTIME in the last 168 hours. Cardiac Enzymes: Recent Labs  Lab 08/14/18 0151  CKTOTAL 347   BNP (last 3 results) No results for input(s): PROBNP in the last 8760 hours. HbA1C: No results for input(s): HGBA1C in the last 72 hours. CBG: No results for input(s): GLUCAP in the last 168 hours. Lipid Profile: No results for input(s): CHOL, HDL, LDLCALC, TRIG, CHOLHDL, LDLDIRECT in the last 72 hours. Thyroid Function Tests: No results for input(s): TSH, T4TOTAL, FREET4, T3FREE, THYROIDAB in the last 72 hours. Anemia Panel: No results for input(s): VITAMINB12, FOLATE, FERRITIN, TIBC, IRON, RETICCTPCT in the last 72 hours. Urine analysis: No results found for: COLORURINE, APPEARANCEUR, LABSPEC, PHURINE, GLUCOSEU, HGBUR, BILIRUBINUR, KETONESUR, PROTEINUR, UROBILINOGEN, NITRITE, LEUKOCYTESUR Sepsis Labs: @LABRCNTIP (procalcitonin:4,lacticidven:4) )No results found for this or  any previous visit (from the past 240 hour(s)).   Radiological Exams on Admission: Dg Chest 2 View  Result Date: 08/13/2018 CLINICAL DATA:  Chest pain EXAM: CHEST - 2 VIEW COMPARISON:  May 24, 2018 FINDINGS: The heart size and mediastinal contours are within normal limits. Both lungs are clear. The visualized skeletal structures are unremarkable. IMPRESSION: No active cardiopulmonary disease. Electronically Signed   By: Katherine Mantlehristopher  Green M.D.   On: 08/13/2018 23:01    EKG: Independently reviewed.  Normal sinus rhythm with a rate of 84.  Possible left atrial enlargement.  Left ventricular hypertrophy with incomplete right bundle branch block no significant ST changes and no old EKG to compare.  Assessment/Plan Principal Problem:   Syncope and collapse Active Problems:   Fatigue   Blood pressure elevated without history of HTN   ARF (acute renal failure) (HCC)  Bradycardia     #1 syncope: Recurrent in nature.  Suspected hypertrophic cardiomyopathy.  Patient will be admitted to telemetry bed.  Get echocardiogram.  Cardiology consult.  Management depending on echo results.  Hydrate patient.  #2 acute kidney injury: BUN/creatinine elevated probably prerenal.  Hydrate the patient and monitor renal function.  #3 elevated blood pressure: Blood pressure is elevated but patient has no documented history of hypertension.  We will monitor closely and initiate treatment if blood pressure is elevated consistently.  #4 bradycardia: Transient bradycardia in the ER.  Monitor on telemetry.  If persistent patient may be required for pacemaker.  It may be the cause of his recurrent syncope.   DVT prophylaxis: Lovenox Code Status: Full code Family Communication: Discussed care with patient fully Disposition Plan: Home Consults called: Patient needs cardiology consult in the morning.  Cardiology called by ER Admission status: Observation  Severity of Illness: The appropriate patient status for  this patient is OBSERVATION. Observation status is judged to be reasonable and necessary in order to provide the required intensity of service to ensure the patient's safety. The patient's presenting symptoms, physical exam findings, and initial radiographic and laboratory data in the context of their medical condition is felt to place them at decreased risk for further clinical deterioration. Furthermore, it is anticipated that the patient will be medically stable for discharge from the hospital within 2 midnights of admission. The following factors support the patient status of observation.   " The patient's presenting symptoms include recurrent syncope. " The physical exam findings include no significant findings on physical exam. " The initial radiographic and laboratory data are abnormal EKG.     Lonia BloodGARBA,LAWAL MD [facility] Pager [phone]  If 7PM-7AM, please contact night-coverage [email or web address] Password [email or web address]  08/14/2018, 3:47 AM

## 2018-08-14 NOTE — ED Notes (Signed)
Patient is c/o chest pain with movement and palp.

## 2018-08-14 NOTE — Progress Notes (Signed)
PROGRESS NOTE                                                                                                                                                                                                             Patient Demographics:    Justin Novak, is a 33 y.o. male, DOB - 14-Aug-1985, ZOX:[id number]  Admit date - 08/13/2018   Admitting Physician Rometta EmeryMohammad L Garba, MD  Outpatient Primary MD for the patient is Farris HasMorrow, Aaron, MD  LOS - 0  Outpatient Specialists: None  Chief Complaint  Patient presents with  . Chest Pain / Panic Attack       Brief Narrative 33 year old male with history of ADHD with recurrent episodes of syncopal and near syncopal episodes for past few years.  Symptoms have been more frequent in the past few months and aggravated with exertion, never at rest.  Symptoms are associated with feeling tired, racing of the heart and chest tightness.  He reports that syncopal episodes last only for few seconds.  No history of sudden cardiac death in family.  Denies any alcohol use or illicit drug use.  At baseline he is quite active.  Denies any stressors.  On the evening of admission while at work he was lifting heavy boxes when he again started noticing his heart racing, felt dizzy with chest tightness and felt like he was going to pass out when his supervisor held him from falling on the ground.  Denies any bowel or urinary incontinence.  Denies any head injury.  In the ED vitals were stable.  Blood work was unremarkable except for mildly elevated renal function.  CK of 347 and troponin was negative.  CT head and cervical spine was unremarkable EKG showed LVH with incomplete RBBB and possible left atrial enlargement. Placed in observation for further management.  COVID-19 was tested negative  Of note he was referred to neurologist by his PCP recently and was seen via telemedicine in the past week.  Patient was  seen by Dr. Terrace ArabiaYan on 6/23 and recommended MRI of the brain and EEG.      Subjective:   Patient noted to be in sinus bradycardia in mid 40s on the monitor.  Denies any chest tightness or dizziness today.  Has not been out of bed.   Assessment  & Plan :  Principal Problem:   Syncope and collapse Suspect more likely cardiac etiology.  Head CT on admission unremarkable.  Check MRI of the brain and EEG.  Was recently seen by neurologist through [facility]. Continue to monitor on telemetry.  Avoid AV nodal blocking agents.  Check 2D echo.  TSH normal. Cardiology consulted for further evaluation.  Active Problems: Sinus bradycardia As outlined above.  Cardiology consulted.  History of ADHD Reports he has been back on medication recently.   Acute kidney injury Mild.  Resolved with IV fluids.    Code Status : Full code  Family Communication  : None at bedside  Disposition Plan  : Home possibly tomorrow if clinically improved and work-up completed  Barriers For Discharge : Active symptoms  Consults  : Cardiology  Procedures  : Head CT, MRI brain, EEG, 2D echo  DVT Prophylaxis  :  Lovenox -   Lab Results  Component Value Date   PLT 215 08/14/2018    Antibiotics  :   Anti-infectives (From admission, onward)   None        Objective:   Vitals:   08/14/18 0300 08/14/18 0353 08/14/18 0415 08/14/18 0746  BP: 128/78 131/72 116/82 115/66  Pulse: (!) 58 63 (!) 55 (!) 51  Resp: 13 18 18 17   Temp:   98.6 F (37 C) 98.1 F (36.7 C)  TempSrc:   Oral Oral  SpO2: 100% 99% 100% 100%    Wt Readings from Last 3 Encounters:  05/24/18 83.9 kg  09/29/17 81 kg  06/14/16 80.7 kg     Intake/Output Summary (Last 24 hours) at 08/14/2018 1324 Last data filed at 08/14/2018 1240 Gross per 24 hour  Intake 360 ml  Output 275 ml  Net 85 ml     Physical Exam  Gen: not in distress HEENT: no pallor, moist mucosa, supple neck Chest: clear b/l, no added sounds CVS: N S1&S2,  no murmurs, rubs or gallop GI: soft, NT, ND, Musculoskeletal: warm, no edema CNS: AAOX3, non focal    Data Review:    CBC Recent Labs  Lab 08/13/18 2221 08/14/18 0601  WBC 6.5 5.7  HGB 12.7* 12.5*  HCT 40.8 39.4  PLT 236 215  MCV 82.9 82.8  MCH 25.8* 26.3  MCHC 31.1 31.7  RDW 13.0 13.2  LYMPHSABS 1.8  --   MONOABS 0.6  --   EOSABS 0.1  --   BASOSABS 0.0  --     Chemistries  Recent Labs  Lab 08/13/18 2221 08/14/18 0601  NA 138 140  K 3.6 3.5  CL 105 107  CO2 20* 26  GLUCOSE 92 99  BUN 15 12  CREATININE 1.30* 0.94  CALCIUM 9.8 9.2  AST 25  --   ALT 22  --   ALKPHOS 50  --   BILITOT 0.3  --    ------------------------------------------------------------------------------------------------------------------ No results for input(s): CHOL, HDL, LDLCALC, TRIG, CHOLHDL, LDLDIRECT in the last 72 hours.  No results found for: HGBA1C ------------------------------------------------------------------------------------------------------------------ No results for input(s): TSH, T4TOTAL, T3FREE, THYROIDAB in the last 72 hours.  Invalid input(s): FREET3 ------------------------------------------------------------------------------------------------------------------ No results for input(s): VITAMINB12, FOLATE, FERRITIN, TIBC, IRON, RETICCTPCT in the last 72 hours.  Coagulation profile No results for input(s): INR, PROTIME in the last 168 hours.  Recent Labs    08/14/18 0151  DDIMER <0.27    Cardiac Enzymes No results for input(s): CKMB, TROPONINI, MYOGLOBIN in the last 168 hours.  Invalid input(s): CK ------------------------------------------------------------------------------------------------------------------ No results found for: BNP  Inpatient  Medications  Scheduled Meds: . enoxaparin (LOVENOX) injection  40 mg Subcutaneous Q24H  . sodium chloride flush  3 mL Intravenous Q12H   Continuous Infusions: . sodium chloride     PRN  Meds:.acetaminophen **OR** acetaminophen, ondansetron **OR** ondansetron (ZOFRAN) IV  Micro Results No results found for this or any previous visit (from the past 240 hour(s)).  Radiology Reports Dg Chest 2 View  Result Date: 08/13/2018 CLINICAL DATA:  Chest pain EXAM: CHEST - 2 VIEW COMPARISON:  May 24, 2018 FINDINGS: The heart size and mediastinal contours are within normal limits. Both lungs are clear. The visualized skeletal structures are unremarkable. IMPRESSION: No active cardiopulmonary disease. Electronically Signed   By: Katherine Mantlehristopher  Green M.D.   On: 08/13/2018 23:01    Time Spent in minutes  25   Elisah Parmer M.D on 08/14/2018 at 1:24 PM  Between 7am to 7pm - Pager - [phone]  After 7pm go to [email or web address] - password [email or web address]  [facility] -  Office  [phone]

## 2018-08-14 NOTE — Procedures (Signed)
EEG Report  Clinical History:  Recurrent syncope/near-syncope.  Telemetry shows sinus bradycardia.    Technical Summary:  A 19 channel digital EEG recording was performed using the 10-20 international system of electrode placement.  Bipolar and Referential montages were used.  The total recording time was approx 20 minutes.  Findings:  There is a posterior dominant rhythm of 10 Hz reactive to eye opening and closure.  No focal slowing is present.  Photic stimulation produces a symmetrical driving response and does not elicit any abnormalities.  He gets drowsy and enters into stage N2 sleep without any abnormalities.  There are no epileptiform discharges or electrographic seizures present.  Impression:  This is a normal EEG in the awake and drowsy states. However, this does not rule out epilepsy.  If clinical suspicion remains high then a more prolonged and/or sleep deprived EEG may be of additional value.  Rogue Jury, MS, MD

## 2018-08-14 NOTE — Procedures (Signed)
Echo attempted. Another test in progress. Will attempt again later.

## 2018-08-15 ENCOUNTER — Other Ambulatory Visit: Payer: Self-pay | Admitting: Student

## 2018-08-15 DIAGNOSIS — N179 Acute kidney failure, unspecified: Secondary | ICD-10-CM | POA: Diagnosis not present

## 2018-08-15 DIAGNOSIS — R5383 Other fatigue: Secondary | ICD-10-CM

## 2018-08-15 DIAGNOSIS — R001 Bradycardia, unspecified: Secondary | ICD-10-CM | POA: Diagnosis not present

## 2018-08-15 DIAGNOSIS — R55 Syncope and collapse: Secondary | ICD-10-CM | POA: Diagnosis not present

## 2018-08-15 LAB — GLUCOSE, CAPILLARY: Glucose-Capillary: 89 mg/dL (ref 70–99)

## 2018-08-15 NOTE — Progress Notes (Signed)
    Dr Francesca Oman consult note reviewed, 33 yo male admitted with recurrent syncope. Normal echo. EKG SR at 80. Some sinus brady on montior mainly to low 50s, nothing to the severity or consistency that would clearly cause his syncope in a 33 year old. Orthostatics negative. We will plan for outpatient 30 day monitor and outpatient cardiology follow up, no additional inpatient workup at this time.      [facility] will sign off.   Medication Recommendations:  n/a Other recommendations (labs, testing, etc):  We will arrange a 30 day event monitor Follow up as an outpatient:  We will arrange 3-4 weeks  For questions or updates, please contact [place] Please consult [email or web address] for contact info under        Signed, Carlyle Dolly, MD  08/15/2018, 7:56 AM

## 2018-08-15 NOTE — Discharge Instructions (Signed)
Ambulatory Cardiac Monitoring An ambulatory cardiac monitor is a small recording device that is used to detect abnormal heart rhythms (arrhythmias). Most monitors are connected by wires to flat, sticky disks (electrodes) that are then attached to your chest. You may need to wear a monitor if you have had symptoms such as:  Fast heartbeats (palpitations).  Dizziness.  Fainting or light-headedness.  Unexplained weakness.  Shortness of breath. There are several types of monitors. Some common monitors include:  Holter monitor. This records your heart rhythm continuously, usually for 24-48 hours.  Event (episodic) monitor. This monitor has a symptoms button, and when pushed, it will begin recording. You need to activate this monitor to record when you have a heart-related symptom.  Automatic detection monitor. This monitor will begin recording when it detects an abnormal heartbeat. What are the risks? Generally, these devices are safe to use. However, it is possible that the skin under the electrodes will become irritated. How to prepare for monitoring Your health care provider will prepare your chest for the electrode placement and show you how to use the monitor.  Do not apply lotions to your chest before monitoring.  Follow directions on how to care for the monitor, and how to return the monitor when the testing period is complete. How to use your cardiac monitor  Follow directions about how long to wear the monitor, and if you can take the monitor off in order to shower or bathe. ? Do not let the monitor get wet. ? Do not bathe, swim, or use a hot tub while wearing the monitor.  Keep your skin clean. Do not put body lotion or moisturizer on your chest.  Change the electrodes as told by your health care provider, or any time they stop sticking to your skin. You may need to use medical tape to keep them on.  Try to put the electrodes in slightly different places on your chest to  help prevent skin irritation. Follow directions from your health care provider about where to place the electrodes.  Make sure the monitor is safely clipped to your clothing or in a location close to your body as recommended by your health care provider.  If your monitor has a symptoms button, press the button to mark an event as soon as you feel a heart-related symptom, such as: ? Dizziness. ? Weakness. ? Light-headedness. ? Palpitations. ? Thumping or pounding in your chest. ? Shortness of breath. ? Unexplained weakness.  Keep a diary of your activities, such as walking, doing chores, and taking medicine. It is very important to note what you were doing when you pushed the button to record your symptoms. This will help your health care provider determine what might be contributing to your symptoms.  Send the recorded information as recommended by your health care provider. It may take some time for your health care provider to process the results.  Change the batteries as told by your health care provider.  Keep electronic devices away from your monitor. These include: ? Tablets. ? MP3 players. ? Cell phones.  While wearing your monitor you should avoid: ? Electric blankets. ? Armed forces operational officer. ? Electric toothbrushes. ? Microwave ovens. ? Magnets. ? Metal detectors. Get help right away if:  You have chest pain.  You have shortness of breath or extreme difficulty breathing.  You develop a very fast heartbeat that does not get better.  You develop dizziness that does not go away.  You faint or constantly feel like  you are about to faint. Summary  An ambulatory cardiac monitor is a small recording device that is used to detect abnormal heart rhythms (arrhythmias).  Make sure you understand how to send the information from the monitor to your health care provider.  It is important to press the button on the monitor when you have any heart-related symptoms.  Keep a  diary of your activities, such as walking, doing chores, and taking medicine. It is very important to note what you were doing when you pushed the button to record your symptoms. This will help your health care provider learn what might be causing your symptoms. This information is not intended to replace advice given to you by your health care provider. Make sure you discuss any questions you have with your health care provider. Document Released: 11/13/2007 Document Revised: 01/16/2017 Document Reviewed: 01/19/2016 Elsevier Patient Education  2020 [employer].

## 2018-08-15 NOTE — Progress Notes (Signed)
Ordered 30-day event monitor for further evaluation of syncope.

## 2018-08-15 NOTE — Discharge Summary (Signed)
Physician Discharge Summary  Justin OaksJustin Novak ZOX:[id number] DOB: January 18, 1986 DOA: 08/13/2018  PCP: Farris HasMorrow, Aaron, MD  Admit date: 08/13/2018 Discharge date: 08/15/2018  Admitted From: Home Disposition: Home  Recommendations for Outpatient Follow-up:  1. Patient will be arranged for 30-day event monitor by cardiology in the next few days  Home Health: None Equipment/Devices: None  Discharge Condition: Fair CODE STATUS: Full code  Diet recommendation: Regular    Discharge Diagnoses:  Principal Problem:   Syncope and collapse  Active Problems:   Fatigue   Blood pressure elevated without history of HTN   Bradycardia   AKI (acute kidney injury) (HCC)  Brief narrative/HPI Brief Narrative 33 year old male with history of ADHD with recurrent episodes of syncopal and near syncopal episodes for past few years.  Symptoms have been more frequent in the past few months and aggravated with exertion, never at rest.  Symptoms are associated with feeling tired, racing of the heart and chest tightness.  He reports that syncopal episodes last only for few seconds.  No history of sudden cardiac death in family.  Denies any alcohol use or illicit drug use.  At baseline he is quite active.  Denies any stressors.  On the evening of admission while at work he was lifting heavy boxes when he again started noticing his heart racing, felt dizzy with chest tightness and felt like he was going to pass out when his supervisor held him from falling on the ground.  Denies any bowel or urinary incontinence.  Denies any head injury.  In the ED vitals were stable.  Blood work was unremarkable except for mildly elevated renal function.  CK of 347 and troponin was negative.  CT head and cervical spine was unremarkable EKG showed LVH with incomplete RBBB and possible left atrial enlargement. Placed in observation for further management.  COVID-19 was tested negative  Of note he was referred to neurologist by his PCP  recently and was seen via telemedicine in the past week.  Patient was seen by Dr. Terrace ArabiaYan on 6/23 and recommended MRI of the brain and EEG.  Hospital course  Principal Problem:   Syncope and collapse Etiology unclear.  Head CT on admission was unremarkable.  MRI of the brain and EEG was negative for any significant findings.  2D echo showed normal EF with no wall motion abnormality, LV thickness or findings of HOCM. Patient stable on telemetry with occasional heart rate dropping down to high 40s but asymptomatic. Patient seen by cardiology and recommend outpatient 30-day event monitor which they will arrange in the next few days. He stable to be discharged home.  Active Problems: Sinus bradycardia As outlined above.    Asymptomatic.  History of ADHD Reports he has been back on medication recently.   Acute kidney injury Mild.  Possibly prerenal Resolved with IV fluids.    Code Status : Full code  Family Communication  : None at bedside  Disposition Plan  : Home   Consults  : Cardiology  Procedures  : Head CT, MRI brain, EEG, 2D echo   Discharge Instructions   Allergies as of 08/15/2018   No Known Allergies     Medication List    You have not been prescribed any medications.    Follow-up Information    Lars MassonNelson, Katarina H, MD Follow up in 1 week(s).   Specialty: Cardiology Why: office will call Contact information: [address] [place] [phone]          No  Known Allergies    Procedures/Studies: Dg Chest 2 View  Result Date: 08/13/2018 CLINICAL DATA:  Chest pain EXAM: CHEST - 2 VIEW COMPARISON:  May 24, 2018 FINDINGS: The heart size and mediastinal contours are within normal limits. Both lungs are clear. The visualized skeletal structures are unremarkable. IMPRESSION: No active cardiopulmonary disease. Electronically Signed   By: Constance Holster M.D.   On: 08/13/2018 23:01   Mr Jeri Cos DG Contrast  Result  Date: 08/14/2018 CLINICAL DATA:  Recurrent syncope EXAM: MRI HEAD WITHOUT AND WITH CONTRAST TECHNIQUE: Multiplanar, multiecho pulse sequences of the brain and surrounding structures were obtained without and with intravenous contrast. CONTRAST:  8.3 mL Gadavist COMPARISON:  Head CT 09/29/2017 FINDINGS: BRAIN: There is no acute infarct, acute hemorrhage or extra-axial collection. The midline structures are normal. The white matter signal is normal for the patient's age. The cerebral and cerebellar volume are age-appropriate. No hydrocephalus. Susceptibility-sensitive sequences show no chronic microhemorrhage or superficial siderosis. No midline shift or other mass effect. VASCULAR: The major intracranial arterial and venous sinus flow voids are normal. SKULL AND UPPER CERVICAL SPINE: Calvarial bone marrow signal is normal. There is no skull base mass. Visualized upper cervical spine and soft tissues are normal. SINUSES/ORBITS: No fluid levels or advanced mucosal thickening. No mastoid or middle ear effusion. The orbits are normal. IMPRESSION: Normal brain MRI. Electronically Signed   By: Ulyses Jarred M.D.   On: 08/14/2018 23:47    2D echo IMPRESSIONS    1. The left ventricle has normal systolic function with an ejection fraction of 60-65%. The cavity size was normal. Left ventricular diastolic parameters were normal.  2. The right ventricle has normal systolic function. The cavity was normal. There is no increase in right ventricular wall thickness.  3. Mild thickening of the mitral valve leaflet.   Subjective: Denies any symptoms.  Stable on telemetry overnight.  Discharge Exam: Vitals:   08/15/18 0004 08/15/18 0534  BP: 119/75 110/71  Pulse: (!) 55 (!) 53  Resp: 18 18  Temp: 98.5 F (36.9 C) 97.9 F (36.6 C)  SpO2: 100% 98%   Vitals:   08/14/18 2007 08/15/18 0004 08/15/18 0534 08/15/18 0536  BP: (!) 116/58 119/75 110/71   Pulse: 62 (!) 55 (!) 53   Resp: 18 18 18    Temp: 98 F (36.7  C) 98.5 F (36.9 C) 97.9 F (36.6 C)   TempSrc: Oral Oral Oral   SpO2: 100% 100% 98%   Weight:    87.1 kg    General: Young male not in distress HEENT: Moist mucosa, supple neck Chest: Clear bilaterally CVs: Normal S1-S2, no murmurs GI: Soft, nondistended, nontender Musculoskeletal: Warm, no edema      The results of significant diagnostics from this hospitalization (including imaging, microbiology, ancillary and laboratory) are listed below for reference.     Microbiology: No results found for this or any previous visit (from the past 240 hour(s)).   Labs: BNP (last 3 results) No results for input(s): BNP in the last 8760 hours. Basic Metabolic Panel: Recent Labs  Lab 08/13/18 2221 08/14/18 0601  NA 138 140  K 3.6 3.5  CL 105 107  CO2 20* 26  GLUCOSE 92 99  BUN 15 12  CREATININE 1.30* 0.94  CALCIUM 9.8 9.2   Liver Function Tests: Recent Labs  Lab 08/13/18 2221  AST 25  ALT 22  ALKPHOS 50  BILITOT 0.3  PROT 7.8  ALBUMIN 4.5   No results for input(s): LIPASE, AMYLASE  in the last 168 hours. No results for input(s): AMMONIA in the last 168 hours. CBC: Recent Labs  Lab 08/13/18 2221 08/14/18 0601  WBC 6.5 5.7  NEUTROABS 3.9  --   HGB 12.7* 12.5*  HCT 40.8 39.4  MCV 82.9 82.8  PLT 236 215   Cardiac Enzymes: Recent Labs  Lab 08/14/18 0151  CKTOTAL 347   BNP: Invalid input(s): POCBNP CBG: Recent Labs  Lab 08/14/18 0646 08/15/18 0603  GLUCAP 92 89   D-Dimer Recent Labs    08/14/18 0151  DDIMER <0.27   Hgb A1c No results for input(s): HGBA1C in the last 72 hours. Lipid Profile No results for input(s): CHOL, HDL, LDLCALC, TRIG, CHOLHDL, LDLDIRECT in the last 72 hours. Thyroid function studies No results for input(s): TSH, T4TOTAL, T3FREE, THYROIDAB in the last 72 hours.  Invalid input(s): FREET3 Anemia work up No results for input(s): VITAMINB12, FOLATE, FERRITIN, TIBC, IRON, RETICCTPCT in the last 72 hours. Urinalysis No  results found for: COLORURINE, APPEARANCEUR, LABSPEC, PHURINE, GLUCOSEU, HGBUR, BILIRUBINUR, KETONESUR, PROTEINUR, UROBILINOGEN, NITRITE, LEUKOCYTESUR Sepsis Labs Invalid input(s): PROCALCITONIN,  WBC,  LACTICIDVEN Microbiology No results found for this or any previous visit (from the past 240 hour(s)).   Time coordinating discharge: <30 minutes  SIGNED:   Eddie NorthNishant Clayten Allcock, MD  [facility] 08/15/2018, 11:14 AM Pager   If 7PM-7AM, please contact night-coverage [email or web address] Password [email or web address]

## 2018-08-16 ENCOUNTER — Telehealth: Payer: Self-pay | Admitting: *Deleted

## 2018-08-16 NOTE — Progress Notes (Signed)
Cardiology Office Note:    Date:  08/17/2018   ID:  Justin OaksJustin Steege, DOB 07/27/1985, MRN [id number]  PCP:  Farris HasMorrow, Aaron, MD  Cardiologist:  Norman HerrlichBrian Munley, MD   Referring MD: Farris HasMorrow, Aaron, MD  ASSESSMENT:    1. Syncope, unspecified syncope type   2. Chest pain of uncertain etiology    PLAN:    In order of problems listed above:  1. After discussion with the patient he is never quite lost consciousness I think his symptoms are more lightheadedness weakness I think he has had a very extensive evaluation will do a 2-week ZIO monitor and I would not advise him to have an implanted loop recorder.  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 2. He has nonanginal chest pain reproducible on exam most consistent with costochondral pain syndrome.  He is very concerned about presence or absence of heart disease would like to know what is wrong and after discussion will undergo further evaluation with a myocardial perfusion study for reassurance.  His echocardiogram showed no evidence of cardiomyopathy.  Next appointment 6 weeks   Medication Adjustments/Labs and Tests Ordered: Current medicines are reviewed at length with the patient today.  Concerns regarding medicines are outlined above.  No orders of the defined types were placed in this encounter.  No orders of the defined types were placed in this encounter.    Chief Complaint  Patient presents with  . Loss of Consciousness    History of Present Illness:    Justin Novak is a 33 y.o. male who is being seen in follow-up to his [facility] He is [facility]12/2020 08/15/2018 after multiple episodes of syncope and near syncope.  Head CT and MRI and EEG were all normal.  Echocardiogram performed 08/14/2018 showed normal left ventricular systolic and diastolic function no evidence of cardiomyopathy and no  significant valvular abnormality.  He was [facility] in [facility] telemetry showed sinus bradycardia with rates in the low 50s and there is a recommendation for him to wear an ambulatory 30-day heart rhythm monitor for further evaluation. Past Medical History:  Diagnosis Date  . Fatigue 08/13/2018  . History of ADHD   . Loss of alertness 08/10/2018  . Syncopal episodes 08/13/2018  . Syncope     Past Surgical History:  Procedure Laterality Date  . NO PAST SURGERIES      Current Medications: No outpatient medications have been marked as taking for the 08/17/18 encounter (Office Visit) with Baldo DaubMunley, Brian J, MD.     Allergies:   Patient has no known allergies.   Social History   Socioeconomic History  . Marital status: Married    Spouse name: Not on file  . Number of children: 3  . Years of education: some college  . Highest education level: Not on file  Occupational History  . Not on file  Social Needs  . Financial resource strain: Not on file  . Food insecurity    Worry: Not on file    Inability: Not on file  . Transportation needs    Medical: Not on file    Non-medical: Not on file  Tobacco Use  . Smoking status: Never Smoker  . Smokeless tobacco: Never Used  Substance and Sexual Activity  . Alcohol use: No  . Drug use: Never  . Sexual activity: Not on file  Lifestyle  .  Physical activity    Days per week: Not on file    Minutes per session: Not on file  . Stress: Not on file  Relationships  . Social Herbalist on phone: Not on file    Gets together: Not on file    Attends religious service: Not on file    Active member of club or organization: Not on file    Attends meetings of clubs or organizations: Not on file    Relationship status: Not on file  Other Topics Concern  . Not on file  Social History Narrative   No caffeine use.     Family History: The patient's family history includes ADD / ADHD in his father; Asthma in his  brother and mother; Healthy in his sister and sister.  ROS:   ROS Please see the history of present illness.     All other systems reviewed and are negative.  EKGs/Labs/Other Studies Reviewed:    The following studies were reviewed today:   EKG:  EKG 08/15/2018 personally reviewed and demonstrates sinus rhythm normal for age RSR prime variant in V1 voltage is normal for age 12  Recent Labs: 08/13/2018: ALT 22 08/14/2018: BUN 12; Creatinine, Ser 0.94; Hemoglobin 12.5; Platelets 215; Potassium 3.5; Sodium 140  Recent Lipid Panel No results found for: CHOL, TRIG, HDL, CHOLHDL, VLDL, LDLCALC, LDLDIRECT  Physical Exam:    VS:  BP 138/90 (BP Location: Right Arm, Patient Position: Sitting, Cuff Size: Large)   Pulse 68   Temp (!) 96.3 F (35.7 C)   Ht 6' (1.829 m)   Wt 197 lb (89.4 kg)   SpO2 100%   BMI 26.72 kg/m     Wt Readings from Last 3 Encounters:  08/17/18 197 lb (89.4 kg)  08/15/18 192 lb (87.1 kg)  08/14/18 185 lb (83.9 kg)     GEN:  Well nourished, well developed in no acute distress HEENT: Normal NECK: No JVD; No carotid bruits LYMPHATICS: No lymphadenopathy CARDIAC: He is tender along the left costochondral junction that reproduces his complaint of chest pain RRR, no murmurs, rubs, gallops RESPIRATORY:  Clear to auscultation without rales, wheezing or rhonchi  ABDOMEN: Soft, non-tender, non-distended MUSCULOSKELETAL:  No edema; No deformity  SKIN: Warm and dry NEUROLOGIC:  Alert and oriented x 3 PSYCHIATRIC:  Normal affect     Signed, Shirlee More, MD  08/17/2018 2:18 PM    [facility]

## 2018-08-16 NOTE — Telephone Encounter (Signed)
08/16/18 Contacted patient to arrange for a 30 day cardiac event monitor to be shipped to his home.  Patient states, he was told the monitor would be put on at the [facility] office during his office visit with Dr. Bettina Gavia on 08/17/18.

## 2018-08-17 ENCOUNTER — Ambulatory Visit (INDEPENDENT_AMBULATORY_CARE_PROVIDER_SITE_OTHER): Payer: 59 | Admitting: Cardiology

## 2018-08-17 ENCOUNTER — Encounter (HOSPITAL_COMMUNITY): Payer: Self-pay | Admitting: Cardiology

## 2018-08-17 ENCOUNTER — Encounter: Payer: Self-pay | Admitting: *Deleted

## 2018-08-17 ENCOUNTER — Other Ambulatory Visit: Payer: Self-pay

## 2018-08-17 ENCOUNTER — Encounter: Payer: Self-pay | Admitting: Cardiology

## 2018-08-17 VITALS — BP 138/90 | HR 68 | Temp 96.3°F | Ht 72.0 in | Wt 197.0 lb

## 2018-08-17 DIAGNOSIS — R0789 Other chest pain: Secondary | ICD-10-CM

## 2018-08-17 DIAGNOSIS — R55 Syncope and collapse: Secondary | ICD-10-CM

## 2018-08-17 DIAGNOSIS — R079 Chest pain, unspecified: Secondary | ICD-10-CM | POA: Insufficient documentation

## 2018-08-17 LAB — NOVEL CORONAVIRUS, NAA (HOSP ORDER, SEND-OUT TO REF LAB; TAT 18-24 HRS): SARS-CoV-2, NAA: NOT DETECTED

## 2018-08-17 MED ORDER — CELECOXIB 100 MG PO CAPS: 100.0000 mg | ORAL_CAPSULE | Freq: Two times a day (BID) | ORAL | 1 refills | Status: DC

## 2018-08-17 NOTE — Patient Instructions (Addendum)
Medication Instructions:   START: Celebrex 100 mg : Take 1 tab twice daily  If you need a refill on your cardiac medications before your next appointment, please call your pharmacy.   Lab work: None If you have labs (blood work) drawn today and your tests are completely normal, you will receive your results only by: Marland Kitchen MyChart Message (if you have MyChart) OR . A paper copy in the mail If you have any lab test that is abnormal or we need to change your treatment, we will call you to review the results.  Testing/Procedures: Your physician has recommended that you wear a ZIO monitor. ZIO monitors are medical devices that record the heart's electrical activity. Doctors most often use these monitors to diagnose arrhythmias. Arrhythmias are problems with the speed or rhythm of the heartbeat. The monitor is a small, portable device. You can wear one while you do your normal daily activities. This is usually used to diagnose what is causing palpitations/syncope (passing out).  Your physician has requested that you have a lexiscan myoview. For further information please visit [email or web address]. Please follow instruction sheet, as given.   Follow-Up: At [facility], you and your health needs are our priority.  As part of our continuing mission to provide you with exceptional heart care, we have created designated Provider Care Teams.  These Care Teams include your primary Cardiologist (physician) and Advanced Practice Providers (APPs -  Physician Assistants and Nurse Practitioners) who all work together to provide you with the care you need, when you need it. You will need a follow up appointment in 6 weeks.  Any Other Special Instructions Will Be Listed Below (If Applicable).   Cardiac Nuclear Scan A cardiac nuclear scan is a test that is done to check the flow of blood to your heart. It is done when you are resting and when you are exercising. The test looks for problems such as:  Not enough  blood reaching a portion of the heart.  The heart muscle not working as it should. You may need this test if:  You have heart disease.  You have had lab results that are not normal.  You have had heart surgery or a balloon procedure to open up blocked arteries (angioplasty).  You have chest pain.  You have shortness of breath. In this test, a special dye (tracer) is put into your bloodstream. The tracer will travel to your heart. A camera will then take pictures of your heart to see how the tracer moves through your heart. This test is usually done at a hospital and takes 2-4 hours. Tell a doctor about:  Any allergies you have.  All medicines you are taking, including vitamins, herbs, eye drops, creams, and over-the-counter medicines.  Any problems you or family members have had with anesthetic medicines.  Any blood disorders you have.  Any surgeries you have had.  Any medical conditions you have.  Whether you are pregnant or may be pregnant. What are the risks? Generally, this is a safe test. However, problems may occur, such as:  Serious chest pain and heart attack. This is only a risk if the stress portion of the test is done.  Rapid heartbeat.  A feeling of warmth in your chest. This feeling usually does not last long.  Allergic reaction to the tracer. What happens before the test?  Ask your doctor about changing or stopping your normal medicines. This is important.  Follow instructions from your doctor about what you cannot eat  or drink.  Remove your jewelry on the day of the test. What happens during the test?  An IV tube will be inserted into one of your veins.  Your doctor will give you a small amount of tracer through the IV tube.  You will wait for 20-40 minutes while the tracer moves through your bloodstream.  Your heart will be monitored with an electrocardiogram (ECG).  You will lie down on an exam table.  Pictures of your heart will be taken  for about 15-20 minutes.  You may also have a stress test. For this test, one of these things may be done: ? You will be asked to exercise on a treadmill or a stationary bike. ? You will be given medicines that will make your heart work harder. This is done if you are unable to exercise.  When blood flow to your heart has peaked, a tracer will again be given through the IV tube.  After 20-40 minutes, you will get back on the exam table. More pictures will be taken of your heart.  Depending on the tracer that is used, more pictures may need to be taken 3-4 hours later.  Your IV tube will be removed when the test is over. The test may vary among doctors and hospitals. What happens after the test?  Ask your doctor: ? Whether you can return to your normal schedule, including diet, activities, and medicines. ? Whether you should drink more fluids. This will help to remove the tracer from your body. Drink enough fluid to keep your pee (urine) pale yellow.  Ask your doctor, or the department that is doing the test: ? When will my results be ready? ? How will I get my results? Summary  A cardiac nuclear scan is a test that is done to check the flow of blood to your heart.  Tell your doctor whether you are pregnant or may be pregnant.  Before the test, ask your doctor about changing or stopping your normal medicines. This is important.  Ask your doctor whether you can return to your normal activities. You may be asked to drink more fluids. This information is not intended to replace advice given to you by your health care provider. Make sure you discuss any questions you have with your health care provider. Document Released: 07/20/2017 Document Revised: 05/26/2018 Document Reviewed: 07/20/2017 Elsevier Patient Education  2020 Elsevier Inc.    Costochondritis Costochondritis is swelling and irritation (inflammation) of the tissue (cartilage) that connects your ribs to your breastbone  (sternum). This causes pain in the front of your chest. Usually, the pain:  Starts gradually.  Is in more than one rib. This condition usually goes away on its own over time. Follow these instructions at home:  Do not do anything that makes your pain worse.  If directed, put ice on the painful area: ? Put ice in a plastic bag. ? Place a towel between your skin and the bag. ? Leave the ice on for 20 minutes, 2-3 times a day.  If directed, put heat on the affected area as often as told by your doctor. Use the heat source that your doctor tells you to use, such as a moist heat pack or a heating pad. ? Place a towel between your skin and the heat source. ? Leave the heat on for 20-30 minutes. ? Take off the heat if your skin turns bright red. This is very important if you cannot feel pain, heat, or cold. You  may have a greater risk of getting burned.  Take over-the-counter and prescription medicines only as told by your doctor.  Return to your normal activities as told by your doctor. Ask your doctor what activities are safe for you.  Keep all follow-up visits as told by your doctor. This is important. Contact a doctor if:  You have chills or a fever.  Your pain does not go away or it gets worse.  You have a cough that does not go away. Get help right away if:  You are short of breath. This information is not intended to replace advice given to you by your health care provider. Make sure you discuss any questions you have with your health care provider. Document Released: 07/23/2007 Document Revised: 02/18/2017 Document Reviewed: 05/30/2015 Elsevier Patient Education  2020 [employer]

## 2018-08-17 NOTE — Addendum Note (Signed)
Addended by: Particia Nearing B on: 08/17/2018 02:36 PM   Modules accepted: Orders

## 2018-08-18 ENCOUNTER — Telehealth (HOSPITAL_COMMUNITY): Payer: Self-pay | Admitting: *Deleted

## 2018-08-18 NOTE — Telephone Encounter (Signed)
Patient given detailed instructions per Myocardial Perfusion Study Information Sheet for the test on 08/23/18 at 7:45. Patient notified to arrive 15 minutes early and that it is imperative to arrive on time for appointment to keep from having the test rescheduled.  If you need to cancel or reschedule your appointment, please call the office within 24 hours of your appointment. . Patient verbalized understanding.Justin Novak\

## 2018-08-23 ENCOUNTER — Other Ambulatory Visit: Payer: Self-pay

## 2018-08-23 ENCOUNTER — Ambulatory Visit (HOSPITAL_COMMUNITY): Payer: 59 | Attending: Internal Medicine

## 2018-08-23 ENCOUNTER — Encounter (HOSPITAL_COMMUNITY): Payer: Self-pay

## 2018-08-23 ENCOUNTER — Encounter: Payer: Self-pay | Admitting: *Deleted

## 2018-08-23 VITALS — Ht 72.0 in | Wt 197.0 lb

## 2018-08-23 DIAGNOSIS — R55 Syncope and collapse: Secondary | ICD-10-CM | POA: Insufficient documentation

## 2018-08-23 DIAGNOSIS — R079 Chest pain, unspecified: Secondary | ICD-10-CM | POA: Diagnosis not present

## 2018-08-23 DIAGNOSIS — R5383 Other fatigue: Secondary | ICD-10-CM | POA: Diagnosis not present

## 2018-08-23 DIAGNOSIS — R0789 Other chest pain: Secondary | ICD-10-CM | POA: Insufficient documentation

## 2018-08-23 LAB — MYOCARDIAL PERFUSION IMAGING
LV dias vol: 158 mL (ref 62–150)
LV sys vol: 81 mL
Peak HR: 104 {beats}/min
Rest HR: 60 {beats}/min
SDS: 0
SRS: 0
SSS: 0
TID: 0.93

## 2018-08-23 MED ORDER — TECHNETIUM TC 99M TETROFOSMIN IV KIT
32.0000 | PACK | Freq: Once | INTRAVENOUS | Status: AC | PRN
  Administered 2018-08-23: 32 via INTRAVENOUS
  Filled 2018-08-23: qty 32

## 2018-08-23 MED ORDER — TECHNETIUM TC 99M TETROFOSMIN IV KIT
10.3000 | PACK | Freq: Once | INTRAVENOUS | Status: AC | PRN
  Administered 2018-08-23: 10.3 via INTRAVENOUS
  Filled 2018-08-23: qty 11

## 2018-08-23 MED ORDER — REGADENOSON 0.4 MG/5ML IV SOLN
0.4000 mg | Freq: Once | INTRAVENOUS | Status: AC
  Administered 2018-08-23: 0.4 mg via INTRAVENOUS

## 2018-08-23 NOTE — Progress Notes (Unsigned)
Patient ID: Justin Novak, male   DOB: 1985-05-11, 33 y.o.   MRN: [id number]  Patient was called to arrange to have 14 day ZIO Patch mailed to his home.  Patient stated he was told by [facility] office monitor was being mailed out the day of his appointment, which, was not documented in EPIC.  Patient was upset because he will not be able to return to work until after he has worn the monitor.  The patient was at his Nuclear Medicine appointment on 08/23/18 when he was called.  Thankfully, the Nuclear Medicine technician was able to retrieve a ZIO XT monitor from the monitor room for the patient, (monitor tech working remotely from home).  Patient will be enrolled to apply ZIO XT [id number] today after his Nuclear Medicine study.  Instructions reviewed briefly as they are included in his monitor kit. While enrolling patient For [id number], a 2nd enrollment popped up.  Patient had been enrolled 7/30 for ZIO XT, it just wasn't documented.  Patient called and told when he receives mailed monitor to just go ahead and mail it back in [employer].  Cathey Endow, from [place] contacted to cancel enrollment for [id number]

## 2018-08-24 ENCOUNTER — Ambulatory Visit (INDEPENDENT_AMBULATORY_CARE_PROVIDER_SITE_OTHER): Payer: 59

## 2018-08-24 DIAGNOSIS — R55 Syncope and collapse: Secondary | ICD-10-CM

## 2018-08-27 ENCOUNTER — Telehealth: Payer: Self-pay | Admitting: *Deleted

## 2018-08-27 DIAGNOSIS — R001 Bradycardia, unspecified: Secondary | ICD-10-CM

## 2018-08-27 DIAGNOSIS — R55 Syncope and collapse: Secondary | ICD-10-CM

## 2018-08-27 NOTE — Telephone Encounter (Signed)
Patient informed of lexiscan results. He had an echocardiogram completed in the hospital on 08/14/2018. Will have Dr. Bettina Gavia review and advise if this test needs to be repeated. Will update patient accordingly. Patient is agreeable and verbalized understanding. No further questions.

## 2018-08-27 NOTE — Telephone Encounter (Signed)
-----   Message from Justin Priest, MD sent at 08/23/2018  3:48 PM EDT ----- Normal or stable result  The test does not show blockage in the arteries there is an indication of heart muscle problem please have him get an echocardiogram performed

## 2018-08-30 NOTE — Telephone Encounter (Signed)
Patient informed that Dr. Bettina Gavia wants to repeat echocardiogram since there was such "a big discrepancy between the nuclear medicine and echocardiogram." Patient is agreeable and has been scheduled for an echocardiogram on Wednesday, 09/08/2018, at 2:15 pm in the [facility] office.

## 2018-08-30 NOTE — Addendum Note (Signed)
Addended by: Austin Miles on: 08/30/2018 09:42 AM   Modules accepted: Orders

## 2018-08-31 ENCOUNTER — Encounter: Payer: Self-pay | Admitting: Cardiology

## 2018-09-08 ENCOUNTER — Other Ambulatory Visit: Payer: Self-pay

## 2018-09-08 ENCOUNTER — Ambulatory Visit (HOSPITAL_BASED_OUTPATIENT_CLINIC_OR_DEPARTMENT_OTHER)
Admission: RE | Admit: 2018-09-08 | Discharge: 2018-09-08 | Disposition: A | Discharge status: Home / Self Care | Payer: 59 | Source: Ambulatory Visit | Attending: Cardiology | Admitting: Cardiology

## 2018-09-08 DIAGNOSIS — R55 Syncope and collapse: Secondary | ICD-10-CM

## 2018-09-08 DIAGNOSIS — R001 Bradycardia, unspecified: Secondary | ICD-10-CM

## 2018-09-08 NOTE — Progress Notes (Signed)
  Echocardiogram 2D Echocardiogram has been performed.  Justin Novak 09/08/2018, 3:02 PM

## 2018-09-15 ENCOUNTER — Telehealth: Payer: Self-pay | Admitting: *Deleted

## 2018-09-15 NOTE — Telephone Encounter (Signed)
Pt called to get results of Echo. Let pt know that the results were normal per Dr. Bettina Gavia. Pt verbalized understanding.

## 2018-09-27 NOTE — Progress Notes (Deleted)
Cardiology Office Note    Date:  09/27/2018   ID:  Justin OaksJustin Bora, DOB [birth year], MRN [id number]  PCP:  Farris HasMorrow, Aaron, MD  Cardiologist: No primary care provider on file. EPS: None  No chief complaint on file.   History of Present Illness:  Justin Novak is a 33 y.o. male who saw Dr. Dulce SellarMunley for near syncope but never lost consciousness and also had chest pain reproducible on exam consistent with costochondral pain. CT, MRI, EEG all normal. Echo 08/14/18 normal systolic and diastolic function no valve abnormality. Was doing heavy exertion in the heat and told to stay hydrated.  Was bradycardic in the hospital so monitor ordered.  Lexiscan negative for ischemia EF 49% so repeat echo ordered and was normal LVEF 60-65%   Past Medical History:  Diagnosis Date  . Fatigue 08/13/2018  . History of ADHD   . Loss of alertness 08/10/2018  . Syncopal episodes 08/13/2018  . Syncope     Past Surgical History:  Procedure Laterality Date  . NO PAST SURGERIES      Current Medications: No outpatient medications have been marked as taking for the 09/28/18 encounter (Appointment) with Dyann KiefLenze, Kayen Grabel M, PA-C.     Allergies:   Patient has no known allergies.   Social History   Socioeconomic History  . Marital status: Married    Spouse name: Not on file  . Number of children: 3  . Years of education: some college  . Highest education level: Not on file  Occupational History  . Not on file  Social Needs  . Financial resource strain: Not on file  . Food insecurity    Worry: Not on file    Inability: Not on file  . Transportation needs    Medical: Not on file    Non-medical: Not on file  Tobacco Use  . Smoking status: Never Smoker  . Smokeless tobacco: Never Used  Substance and Sexual Activity  . Alcohol use: No  . Drug use: Never  . Sexual activity: Not on file  Lifestyle  . Physical activity    Days per week: Not on file    Minutes per session: Not on file  . Stress: Not on  file  Relationships  . Social Musicianconnections    Talks on phone: Not on file    Gets together: Not on file    Attends religious service: Not on file    Active member of club or organization: Not on file    Attends meetings of clubs or organizations: Not on file    Relationship status: Not on file  Other Topics Concern  . Not on file  Social History Narrative   No caffeine use.     Family History:  The patient's ***family history includes ADD / ADHD in his father; Asthma in his brother and mother; Healthy in his sister and sister.   ROS:   Please see the history of present illness.    ROS All other systems reviewed and are negative.   PHYSICAL EXAM:   VS:  There were no vitals taken for this visit.  Physical Exam  GEN: Well nourished, well developed, in no acute distress  HEENT: normal  Neck: no JVD, carotid bruits, or masses Cardiac:RRR; no murmurs, rubs, or gallops  Respiratory:  clear to auscultation bilaterally, normal work of breathing GI: soft, nontender, nondistended, + BS Ext: without cyanosis, clubbing, or edema, Good distal pulses bilaterally MS: no deformity or atrophy  Skin: warm and dry, no  rash Neuro:  Alert and Oriented x 3, Strength and sensation are intact Psych: euthymic mood, full affect  Wt Readings from Last 3 Encounters:  08/23/18 197 lb (89.4 kg)  08/17/18 197 lb (89.4 kg)  08/15/18 192 lb (87.1 kg)      Studies/Labs Reviewed:   EKG:  EKG is not ordered today.  The ekg reviewed from 08/16/18 demonstrates NSR with LVH  Recent Labs: 08/13/2018: ALT 22 08/14/2018: BUN 12; Creatinine, Ser 0.94; Hemoglobin 12.5; Platelets 215; Potassium 3.5; Sodium 140   Lipid Panel No results found for: CHOL, TRIG, HDL, CHOLHDL, VLDL, LDLCALC, LDLDIRECT  Additional studies/ records that were reviewed today include:  Echo 09/08/18 1. The left ventricle has normal systolic function with an ejection fraction of 60-65%. The cavity size was normal. Left ventricular  diastolic parameters were normal. No evidence of left ventricular regional wall motion abnormalities.  2. The right ventricle has normal systolic function. The cavity was normal. There is no increase in right ventricular wall thickness.  3. No evidence of mitral valve stenosis.  4. The aortic valve is tricuspid. No stenosis of the aortic valve.  5. The aorta is normal in size and structure.  6. The aortic root and ascending aorta are normal in size and structure.   FINDINGS  Left Ventricle: The left ventricle has normal systolic function, with an ejection fraction of 60-65%. The cavity size was normal. There is no increase in left ventricular wall thickness. Left ventricular diastolic parameters were normal. Normal left  ventricular filling pressures No evidence of left ventricular regional wall motion abnormalities..   Right Ventricle: The right ventricle has normal systolic function. The cavity was normal. There is no increase in right ventricular wall thickness.   Left Atrium: Left atrial size was normal in size.   Right Atrium: Right atrial size was normal in size. Right atrial pressure is estimated at 3 mmHg.   Interatrial Septum: No atrial level shunt detected by color flow Doppler.   Pericardium: There is no evidence of pericardial effusion.   Mitral Valve: The mitral valve is normal in structure. Mitral valve regurgitation is not visualized by color flow Doppler. No evidence of mitral valve stenosis.   Tricuspid Valve: The tricuspid valve is normal in structure. Tricuspid valve regurgitation was not visualized by color flow Doppler.   Aortic Valve: The aortic valve is tricuspid Aortic valve regurgitation was not visualized by color flow Doppler. There is No stenosis of the aortic valve.   Pulmonic Valve: The pulmonic valve was normal in structure. Pulmonic valve regurgitation is not visualized by color flow Doppler. No evidence of pulmonic stenosis.   Aorta: The aortic root and  ascending aorta are normal in size and structure. The aorta is normal in size and structure.   Venous: The inferior vena cava measures 1.63 cm, is normal in size with greater than 50% respiratory variability.    Lexiscan 7/6/20Electrically negative for ischemia  LV cavity is dilated Consider echo to further define chamber sizes  Normal perfusion. No ischemia or scar  LVEF 49%   Echo 08/14/18 1. The left ventricle has normal systolic function with an ejection fraction of 60-65%. The cavity size was normal. Left ventricular diastolic parameters were normal.  2. The right ventricle has normal systolic function. The cavity was normal. There is no increase in right ventricular wall thickness.  3. Mild thickening of the mitral valve leaflet.   FINDINGS  Left Ventricle: The left ventricle has normal systolic function, with an ejection  fraction of 60-65%. The cavity size was normal. There is no increase in left ventricular wall thickness. Left ventricular diastolic parameters were normal.   Right Ventricle: The right ventricle has normal systolic function. The cavity was normal. There is no increase in right ventricular wall thickness.   Left Atrium: Left atrial size was normal in size.   Right Atrium: Right atrial size was normal in size. Right atrial pressure is estimated at 10 mmHg.   Interatrial Septum: No atrial level shunt detected by color flow Doppler.   Pericardium: There is no evidence of pericardial effusion.   Mitral Valve: The mitral valve is normal in structure. Mild thickening of the mitral valve leaflet. Mitral valve regurgitation is trivial by color flow Doppler.   Tricuspid Valve: The tricuspid valve is normal in structure. Tricuspid valve regurgitation was not visualized by color flow Doppler.   Aortic Valve: The aortic valve is normal in structure. Aortic valve regurgitation was not assessed by color flow Doppler.   Pulmonic Valve: The pulmonic valve was normal in  structure. Pulmonic valve regurgitation was not assessed by color flow Doppler.   Venous: The inferior vena cava is normal in size with greater than 50% respiratory variability.       ASSESSMENT:    No diagnosis found.   PLAN:  In order of problems listed above:   Syncope/presyncope- MRI, CT, EEG normal, lexiscan without ischemia, 2Decho x 2 normal, Monitor  Sinus bradycardia in 50's in hospital   Medication Adjustments/Labs and Tests Ordered: Current medicines are reviewed at length with the patient today.  Concerns regarding medicines are outlined above.  Medication changes, Labs and Tests ordered today are listed in the Patient Instructions below. There are no Patient Instructions on file for this visit.   Signed, Jacolyn ReedyMichele Valeen Borys, PA-C  09/27/2018 2:14 PM    [facility] [address], [place], [place]  [address] Phone: [phone]; Fax: [phone]

## 2018-09-28 ENCOUNTER — Ambulatory Visit: Payer: 59 | Admitting: Physician Assistant

## 2018-09-29 ENCOUNTER — Ambulatory Visit: Payer: 59 | Admitting: Cardiology

## 2018-09-29 NOTE — Progress Notes (Deleted)
Cardiology Office Note:    Date:  09/29/2018   ID:  Justin OaksJustin Stjulien, DOB Apr 13, 1985, MRN [id number]  PCP:  Farris HasMorrow, Aaron, MD  Cardiologist:  Norman HerrlichBrian Avnoor Koury, MD    Referring MD: Farris HasMorrow, Aaron, MD    ASSESSMENT:    No diagnosis found. PLAN:    In order of problems listed above:  1. ***   Next appointment: ***   Medication Adjustments/Labs and Tests Ordered: Current medicines are reviewed at length with the patient today.  Concerns regarding medicines are outlined above.  No orders of the defined types were placed in this encounter.  No orders of the defined types were placed in this encounter.   No chief complaint on file.   History of Present Illness:    Justin Novak is a 33 y.o. male with a hx of syncope last seen 08/17/2018. [facility]2020 08/15/2018 after multiple episodes of syncope and near syncope.  Head CT and MRI and EEG were all normal.  Echocardiogram performed 08/14/2018 showed normal left ventricular systolic and diastolic function no evidence of cardiomyopathy and no significant valvular abnormality.  He was seen in consultation by [facility] telemetry showed sinus bradycardia with rates in the low 50s and there is a recommendation for him to wear an ambulatory 30-day heart rhythm monitor for further evaluation.  . Compliance with diet, lifestyle and medications: *** Past Medical History:  Diagnosis Date  . Fatigue 08/13/2018  . History of ADHD   . Loss of alertness 08/10/2018  . Syncopal episodes 08/13/2018  . Syncope     Past Surgical History:  Procedure Laterality Date  . NO PAST SURGERIES      Current Medications: No outpatient medications have been marked as taking for the 09/29/18 encounter (Appointment) with Baldo DaubMunley, Clint Biello J, MD.     Allergies:   Patient has no known allergies.   Social History   Socioeconomic History  . Marital status: Married    Spouse name: Not on file  . Number of children: 3   . Years of education: some college  . Highest education level: Not on file  Occupational History  . Not on file  Social Needs  . Financial resource strain: Not on file  . Food insecurity    Worry: Not on file    Inability: Not on file  . Transportation needs    Medical: Not on file    Non-medical: Not on file  Tobacco Use  . Smoking status: Never Smoker  . Smokeless tobacco: Never Used  Substance and Sexual Activity  . Alcohol use: No  . Drug use: Never  . Sexual activity: Not on file  Lifestyle  . Physical activity    Days per week: Not on file    Minutes per session: Not on file  . Stress: Not on file  Relationships  . Social Musicianconnections    Talks on phone: Not on file    Gets together: Not on file    Attends religious service: Not on file    Active member of club or organization: Not on file    Attends meetings of clubs or organizations: Not on file    Relationship status: Not on file  Other Topics Concern  . Not on file  Social History Narrative   No caffeine use.     Family History: The patient's ***family history includes ADD / ADHD in his father; Asthma in his brother and mother; Healthy in his sister and  sister. ROS:   Please see the history of present illness.    All other systems reviewed and are negative.  EKGs/Labs/Other Studies Reviewed:    The following studies were reviewed today:  EKG:  EKG ordered today and personally reviewed.  The ekg ordered today demonstrates ***  Echo:  1. The left ventricle has normal systolic function with an ejection fraction of 60-65%. The cavity size was normal. Left ventricular diastolic parameters were normal. No evidence of left ventricular regional wall motion abnormalities.  2. The right ventricle has normal systolic function. The cavity was normal. There is no increase in right ventricular wall thickness.  3. No evidence of mitral valve stenosis.  4. The aortic valve is tricuspid. No stenosis of the aortic valve.   5. The aorta is normal in size and structure.  6. The aortic root and ascending aorta are normal in size and structure.  Myoview: Study Highlights  Electrically negative for ischemia  LV cavity is dilated Consider echo to further define chamber sizes  Normal perfusion. No ischemia or scar  LVEF 49%     Recent Labs: 08/13/2018: ALT 22 08/14/2018: BUN 12; Creatinine, Ser 0.94; Hemoglobin 12.5; Platelets 215; Potassium 3.5; Sodium 140  Recent Lipid Panel No results found for: CHOL, TRIG, HDL, CHOLHDL, VLDL, LDLCALC, LDLDIRECT  Physical Exam:    VS:  There were no vitals taken for this visit.    Wt Readings from Last 3 Encounters:  08/23/18 197 lb (89.4 kg)  08/17/18 197 lb (89.4 kg)  08/15/18 192 lb (87.1 kg)     GEN: *** Well nourished, well developed in no acute distress HEENT: Normal NECK: No JVD; No carotid bruits LYMPHATICS: No lymphadenopathy CARDIAC: ***RRR, no murmurs, rubs, gallops RESPIRATORY:  Clear to auscultation without rales, wheezing or rhonchi  ABDOMEN: Soft, non-tender, non-distended MUSCULOSKELETAL:  No edema; No deformity  SKIN: Warm and dry NEUROLOGIC:  Alert and oriented x 3 PSYCHIATRIC:  Normal affect    Signed, Shirlee More, MD  09/29/2018 7:39 AM    [place]

## 2018-10-06 ENCOUNTER — Ambulatory Visit: Payer: 59 | Admitting: Cardiology

## 2018-10-24 NOTE — Progress Notes (Signed)
Cardiology Office Note:    Date:  10/26/2018   ID:  Justin Novak, DOB 1986-01-19, MRN [id number]  PCP:  Farris Has, MD  Cardiologist:  Norman Herrlich, MD    Referring MD: Farris Has, MD    ASSESSMENT:    1. Costochondral chest pain   2. Syncope and collapse    PLAN:    In order of problems listed above:  1. Improved/resolved finish his Celebrex and then ibuprofen or Aleve as needed.\ 2. Stable no recurrence no evidence of cardiomyopathy or arrhythmia   Next appointment: As needed   Medication Adjustments/Labs and Tests Ordered: Current medicines are reviewed at length with the patient today.  Concerns regarding medicines are outlined above.  No orders of the defined types were placed in this encounter.  No orders of the defined types were placed in this encounter.   Chief Complaint  Patient presents with  . Follow-up  . Chest Pain  . Loss of Consciousness    History of Present Illness:    Justin Novak is a 33 y.o. male with a hx of admission to [facility] 08/12/2020 to 08/15/2018 after multiple episodes of syncope and near syncope.  Head CT and MRI and EEG were all normal.  Echocardiogram performed 08/14/2018 showed normal left ventricular systolic and diastolic function no evidence of cardiomyopathy and no significant valvular abnormality.  He was seen in consultation by [facility] telemetry showed sinus bradycardia with rates in the low 50s and no other abnormality. He was last seen 08/17/2018 with costochondral chest pain.. Compliance with diet, lifestyle and medications: Yes  He is improved with Celebrex is not having chest pain and has had no further episodes of lightheadedness or syncope.  He is very insightful he pays attention to fluid and salt intake and has had no chest pain palpitation shortness of breath.  He has a small number of Celebrex left to finish those and then take ibuprofen or Aleve as needed I asked him to add salt to his  diet drinks several liters of water at work each day I will plan to see back in the office as needed.  He is very reassured after reviewed the results of his echocardiogram and myocardial perfusion study.  He has no evidence of coronary artery disease or cardiomyopathy with normal ejection fraction on echo  08/23/2018: Nuclear Stress Findings Isotope administration Rest isotope was administered  with an IV injection of 10.3 mCi Tc65m Tetrofosmin.  Rest SPECT images were obtained approximately 45 minutes post tracer injection.  Stress isotope was administered  with an IV injection of 32.0 mCi Tc70m Tetrofosmin   Stress SPECT images were obtained approximately 60 minutes post tracer injection.  Nuclear Study Quality Overall image quality is good.  Nuclear Measurements Study was gated.  Rest Perfusion There is a defect present in the mid inferior and apical inferior location.  Stress Perfusion Stress perfusion normal.  Overall Study Impression Myocardial perfusion is normal.   The study is normal.  LV cavity size is moderately enlarged.  Nuclear stress EF:  49%.   There is no prior study for comparison.   Echo 09/08/2018:  1. The left ventricle has normal systolic function with an ejection fraction of 60-65%. The cavity size was normal. Left ventricular diastolic parameters were normal. No evidence of left ventricular regional wall motion abnormalities.  2. The right ventricle has normal systolic function. The cavity was normal. There is no increase in right ventricular wall thickness.  3. No  evidence of mitral valve stenosis.  4. The aortic valve is tricuspid. No stenosis of the aortic valve.  5. The aorta is normal in size and structure.  6. The aortic root and ascending aorta are normal in size and structure.  Past Medical History:  Diagnosis Date  . Fatigue 08/13/2018  . History of ADHD   . Loss of alertness 08/10/2018  . Syncopal episodes 08/13/2018  . Syncope     Past Surgical History:   Procedure Laterality Date  . NO PAST SURGERIES      Current Medications: Current Meds  Medication Sig  . celecoxib (CELEBREX) 100 MG capsule Take 1 capsule (100 mg total) by mouth 2 (two) times daily.     Allergies:   Patient has no known allergies.   Social History   Socioeconomic History  . Marital status: Married    Spouse name: Not on file  . Number of children: 3  . Years of education: some college  . Highest education level: Not on file  Occupational History  . Not on file  Social Needs  . Financial resource strain: Not on file  . Food insecurity    Worry: Not on file    Inability: Not on file  . Transportation needs    Medical: Not on file    Non-medical: Not on file  Tobacco Use  . Smoking status: Never Smoker  . Smokeless tobacco: Never Used  Substance and Sexual Activity  . Alcohol use: No  . Drug use: Never  . Sexual activity: Not on file  Lifestyle  . Physical activity    Days per week: Not on file    Minutes per session: Not on file  . Stress: Not on file  Relationships  . Social Herbalist on phone: Not on file    Gets together: Not on file    Attends religious service: Not on file    Active member of club or organization: Not on file    Attends meetings of clubs or organizations: Not on file    Relationship status: Not on file  Other Topics Concern  . Not on file  Social History Narrative   No caffeine use.     Family History: The patient's family history includes ADD / ADHD in his father; Asthma in his brother and mother; Healthy in his sister and sister. ROS:   Please see the history of present illness.    All other systems reviewed and are negative.  EKGs/Labs/Other Studies Reviewed:    The following studies were reviewed today:   Recent Labs: 08/13/2018: ALT 22 08/14/2018: BUN 12; Creatinine, Ser 0.94; Hemoglobin 12.5; Platelets 215; Potassium 3.5; Sodium 140  Recent Lipid Panel No results found for: CHOL, TRIG, HDL,  CHOLHDL, VLDL, LDLCALC, LDLDIRECT  Physical Exam:    VS:  BP 128/84 (BP Location: Right Arm, Patient Position: Sitting, Cuff Size: Normal)   Pulse (!) 58   Temp (!) 97.3 F (36.3 C)   Ht 6' (1.829 m)   Wt 187 lb 1.9 oz (84.9 kg)   SpO2 91%   BMI 25.38 kg/m     Wt Readings from Last 3 Encounters:  10/26/18 187 lb 1.9 oz (84.9 kg)  08/23/18 197 lb (89.4 kg)  08/17/18 197 lb (89.4 kg)     GEN:  Well nourished, well developed in no acute distress HEENT: Normal NECK: No JVD; No carotid bruits LYMPHATICS: No lymphadenopathy CARDIAC: Wall tenderness RRR, no murmurs, rubs, gallops RESPIRATORY:  Clear to auscultation without rales, wheezing or rhonchi  ABDOMEN: Soft, non-tender, non-distended MUSCULOSKELETAL:  No edema; No deformity  SKIN: Warm and dry NEUROLOGIC:  Alert and oriented x 3 PSYCHIATRIC:  Normal affect    Signed, Norman HerrlichBrian Nonna Renninger, MD  10/26/2018 9:06 AM    [facility]

## 2018-10-26 ENCOUNTER — Encounter: Payer: Self-pay | Admitting: Cardiology

## 2018-10-26 ENCOUNTER — Other Ambulatory Visit: Payer: Self-pay

## 2018-10-26 ENCOUNTER — Ambulatory Visit (INDEPENDENT_AMBULATORY_CARE_PROVIDER_SITE_OTHER): Payer: 59 | Admitting: Cardiology

## 2018-10-26 VITALS — BP 128/84 | HR 58 | Temp 97.3°F | Ht 72.0 in | Wt 187.1 lb

## 2018-10-26 DIAGNOSIS — R55 Syncope and collapse: Secondary | ICD-10-CM | POA: Diagnosis not present

## 2018-10-26 DIAGNOSIS — R071 Chest pain on breathing: Secondary | ICD-10-CM

## 2018-10-26 DIAGNOSIS — R0789 Other chest pain: Secondary | ICD-10-CM

## 2018-10-26 MED ORDER — IBUPROFEN 200 MG PO TABS: 200.0000 mg | ORAL_TABLET | Freq: Four times a day (QID) | ORAL | 0 refills | Status: DC | PRN

## 2018-10-26 MED ORDER — IBUPROFEN 200 MG PO TABS: 200.0000 mg | ORAL_TABLET | Freq: Four times a day (QID) | ORAL | 0 refills | Status: AC | PRN

## 2018-10-26 NOTE — Patient Instructions (Signed)
Medication Instructions:  Your physician has recommended you make the following change in your medication:  Finish your celebrex then STOP. When you stop celebrex, START ibuprofen (advil) over the counter as needed for chest pain.   If you need a refill on your cardiac medications before your next appointment, please call your pharmacy.   Lab work: None  If you have labs (blood work) drawn today and your tests are completely normal, you will receive your results only by: Marland Kitchen MyChart Message (if you have MyChart) OR . A paper copy in the mail If you have any lab test that is abnormal or we need to change your treatment, we will call you to review the results.  Testing/Procedures: None  Follow-Up: At [facility], you and your health needs are our priority.  As part of our continuing mission to provide you with exceptional heart care, we have created designated Provider Care Teams.  These Care Teams include your primary Cardiologist (physician) and Advanced Practice Providers (APPs -  Physician Assistants and Nurse Practitioners) who all work together to provide you with the care you need, when you need it. You will need a follow up appointment as needed if symptoms worsen or fail to improve.

## 2018-10-26 NOTE — Addendum Note (Signed)
Addended by: Austin Miles on: 10/26/2018 09:22 AM   Modules accepted: Orders

## 2018-11-30 ENCOUNTER — Ambulatory Visit (HOSPITAL_COMMUNITY)
Admission: EM | Admit: 2018-11-30 | Discharge: 2018-11-30 | Disposition: A | Discharge status: Home / Self Care | Payer: 59 | Attending: Emergency Medicine | Admitting: Emergency Medicine

## 2018-11-30 ENCOUNTER — Encounter (HOSPITAL_COMMUNITY): Payer: Self-pay

## 2018-11-30 DIAGNOSIS — Z202 Contact with and (suspected) exposure to infections with a predominantly sexual mode of transmission: Secondary | ICD-10-CM | POA: Diagnosis not present

## 2018-11-30 LAB — POCT URINALYSIS DIP (DEVICE)
Bilirubin Urine: NEGATIVE
Glucose, UA: NEGATIVE mg/dL
Hgb urine dipstick: NEGATIVE
Nitrite: NEGATIVE
Protein, ur: NEGATIVE mg/dL
Specific Gravity, Urine: 1.025 (ref 1.005–1.030)
Urobilinogen, UA: 1 mg/dL (ref 0.0–1.0)
pH: 7 (ref 5.0–8.0)

## 2018-11-30 MED ORDER — METRONIDAZOLE 500 MG PO TABS: 500.0000 mg | ORAL_TABLET | Freq: Once | ORAL | Status: DC

## 2018-11-30 MED ORDER — METRONIDAZOLE 500 MG PO TABS
ORAL_TABLET | ORAL | Status: AC
  Filled 2018-11-30: qty 1

## 2018-11-30 MED ORDER — AZITHROMYCIN 250 MG PO TABS
ORAL_TABLET | ORAL | Status: AC
  Filled 2018-11-30: qty 4

## 2018-11-30 MED ORDER — AZITHROMYCIN 250 MG PO TABS
1000.0000 mg | ORAL_TABLET | Freq: Once | ORAL | Status: AC
  Administered 2018-11-30: 1000 mg via ORAL

## 2018-11-30 MED ORDER — CEFTRIAXONE SODIUM 250 MG IJ SOLR
250.0000 mg | Freq: Once | INTRAMUSCULAR | Status: AC
  Administered 2018-11-30: 250 mg via INTRAMUSCULAR

## 2018-11-30 MED ORDER — LIDOCAINE HCL (PF) 1 % IJ SOLN
INTRAMUSCULAR | Status: AC
  Filled 2018-11-30: qty 2

## 2018-11-30 MED ORDER — CEFTRIAXONE SODIUM 250 MG IJ SOLR
INTRAMUSCULAR | Status: AC
  Filled 2018-11-30: qty 250

## 2018-11-30 MED ORDER — METRONIDAZOLE 500 MG PO TABS
ORAL_TABLET | ORAL | Status: AC
  Filled 2018-11-30: qty 3

## 2018-11-30 MED ORDER — METRONIDAZOLE 500 MG PO TABS
2000.0000 mg | ORAL_TABLET | Freq: Once | ORAL | Status: AC
  Administered 2018-11-30: 2000 mg via ORAL

## 2018-11-30 NOTE — ED Provider Notes (Signed)
[facility]    CSN: [id number] Arrival date & time: 11/30/18  1101      History   Chief Complaint Chief Complaint  Patient presents with  . Exposure to STD    HPI Justin Novak is a 33 y.o. male presenting today for evaluation of STDs after exposure.  Patient states that his partner recently tested positive for trichomonas and had an indeterminant test for gonorrhea.  He himself has not had any symptoms.  Denies dysuria, increased frequency, penile discharge.  Denies testicular pain or swelling.  Denies rashes or lesions.  HPI  Past Medical History:  Diagnosis Date  . Fatigue 08/13/2018  . History of ADHD   . Loss of alertness 08/10/2018  . Syncopal episodes 08/13/2018  . Syncope     Patient Active Problem List   Diagnosis Date Noted  . Chest pain of uncertain etiology 08/17/2018  . AKI (acute kidney injury) (HCC) 08/15/2018  . Syncope and collapse 08/14/2018  . Blood pressure elevated without history of HTN 08/14/2018  . Bradycardia 08/14/2018  . Fatigue 08/13/2018  . Syncopal episodes 08/13/2018  . Loss of alertness 08/10/2018    Past Surgical History:  Procedure Laterality Date  . NO PAST SURGERIES         Home Medications    Prior to Admission medications   Medication Sig Start Date End Date Taking? Authorizing Provider  ibuprofen (ADVIL) 200 MG tablet Take 1 tablet (200 mg total) by mouth every 6 (six) hours as needed. 10/26/18   Baldo DaubMunley, Brian J, MD  VYVANSE 30 MG capsule  11/23/18   [provider]    Family History Family History  Problem Relation Age of Onset  . Asthma Mother   . ADD / ADHD Father   . Healthy Sister   . Asthma Brother   . Healthy Sister     Social History Social History   Tobacco Use  . Smoking status: Never Smoker  . Smokeless tobacco: Never Used  Substance Use Topics  . Alcohol use: No  . Drug use: Never     Allergies   Patient has no known allergies.   Review of Systems Review of Systems   Constitutional: Negative for fever.  HENT: Negative for sore throat.   Respiratory: Negative for shortness of breath.   Cardiovascular: Negative for chest pain.  Gastrointestinal: Negative for abdominal pain, nausea and vomiting.  Genitourinary: Negative for difficulty urinating, discharge, dysuria, frequency, penile pain, penile swelling, scrotal swelling and testicular pain.  Skin: Negative for rash.  Neurological: Negative for dizziness, light-headedness and headaches.     Physical Exam Triage Vital Signs ED Triage Vitals  Enc Vitals Group     BP 11/30/18 1146 134/77     Pulse Rate 11/30/18 1146 91     Resp 11/30/18 1146 16     Temp 11/30/18 1146 99.2 F (37.3 C)     Temp Source 11/30/18 1146 Oral     SpO2 11/30/18 1146 98 %     Weight --      Height --      Head Circumference --      Peak Flow --      Pain Score 11/30/18 1144 0     Pain Loc --      Pain Edu? --      Excl. in GC? --    No data found.  Updated Vital Signs BP 134/77 (BP Location: Right Arm)   Pulse 91   Temp 99.2 F (  37.3 C) (Oral)   Resp 16   SpO2 98%   Visual Acuity Right Eye Distance:   Left Eye Distance:   Bilateral Distance:    Right Eye Near:   Left Eye Near:    Bilateral Near:     Physical Exam Vitals signs and nursing note reviewed.  Constitutional:      Appearance: He is well-developed.     Comments: No acute distress  HENT:     Head: Normocephalic and atraumatic.     Nose: Nose normal.  Eyes:     Conjunctiva/sclera: Conjunctivae normal.  Neck:     Musculoskeletal: Neck supple.  Cardiovascular:     Rate and Rhythm: Normal rate.  Pulmonary:     Effort: Pulmonary effort is normal. No respiratory distress.  Abdominal:     General: There is no distension.     Comments: Soft, nondistended, nontender light deep palpation  Musculoskeletal: Normal range of motion.  Skin:    General: Skin is warm and dry.  Neurological:     Mental Status: He is alert and oriented to person,  place, and time.      UC Treatments / Results  Labs (all labs ordered are listed, but only abnormal results are displayed) Labs Reviewed  POCT URINALYSIS DIP (DEVICE) - Abnormal; Notable for the following components:      Result Value   Ketones, ur TRACE (*)    Leukocytes,Ua TRACE (*)    All other components within normal limits  CYTOLOGY, (ORAL, ANAL, URETHRAL) ANCILLARY ONLY    EKG   Radiology No results found.  Procedures Procedures (including critical care time)  Medications Ordered in UC Medications  azithromycin (ZITHROMAX) tablet 1,000 mg (1,000 mg Oral Given 11/30/18 1245)  cefTRIAXone (ROCEPHIN) injection 250 mg (250 mg Intramuscular Given 11/30/18 1246)  metroNIDAZOLE (FLAGYL) tablet 2,000 mg (2,000 mg Oral Given 11/30/18 1245)  metroNIDAZOLE (FLAGYL) 500 MG tablet (has no administration in time range)  azithromycin (ZITHROMAX) 250 MG tablet (has no administration in time range)  cefTRIAXone (ROCEPHIN) 250 MG injection (has no administration in time range)  metroNIDAZOLE (FLAGYL) 500 MG tablet (has no administration in time range)  lidocaine (PF) (XYLOCAINE) 1 % injection (has no administration in time range)    Initial Impression / Assessment and Plan / UC Course  I have reviewed the triage vital signs and the nursing notes.  Pertinent labs & imaging results that were available during my care of the patient were reviewed by me and considered in my medical decision making (see chart for details).     Patient currently asymptomatic, but given exposure will go ahead and empirically treat for trichomonas as well as gonorrhea today.  Provided 2 g metronidazole, 1 g azithromycin and 250 mg Rocephin.  Avoid sexual intercourse x1 week.  Swab obtained to confirm.Discussed strict return precautions. Patient verbalized understanding and is agreeable with plan.  Final Clinical Impressions(s) / UC Diagnoses   Final diagnoses:  Exposure to STD     Discharge  Instructions     We have treated you today for gonorrhea and trich, with rocephin, azithromycin and flagyl. Please refrain from sexual activity for 7 days while medicine is clearing infection. No alcohol for 48 hours.  We are testing you for Gonorrhea, Chlamydia and Trichomonas. We will call you if anything is positive and let you know if you require any further treatment. Please inform partner of any positive results.  Please return if symptoms not improving with treatment, development of fever, nausea,  vomiting, abdominal pain, scrotal pain.   ED Prescriptions    None     PDMP not reviewed this encounter.   Aerik Polan, [place] C, PA-C 11/30/18 1300

## 2018-11-30 NOTE — ED Triage Notes (Signed)
Pt reports his wife has Trichomonas and possible Gonorrhea. Pt denies andy sign and symptoms.

## 2018-11-30 NOTE — Discharge Instructions (Signed)
We have treated you today for gonorrhea and trich, with rocephin, azithromycin and flagyl. Please refrain from sexual activity for 7 days while medicine is clearing infection. No alcohol for 48 hours.  We are testing you for Gonorrhea, Chlamydia and Trichomonas. We will call you if anything is positive and let you know if you require any further treatment. Please inform partner of any positive results.  Please return if symptoms not improving with treatment, development of fever, nausea, vomiting, abdominal pain, scrotal pain.

## 2018-12-01 LAB — CYTOLOGY, (ORAL, ANAL, URETHRAL) ANCILLARY ONLY
Chlamydia: NEGATIVE
Neisseria Gonorrhea: NEGATIVE
Trichomonas: POSITIVE — AB

## 2018-12-02 ENCOUNTER — Telehealth (HOSPITAL_COMMUNITY): Payer: Self-pay | Admitting: Emergency Medicine

## 2018-12-02 NOTE — Telephone Encounter (Signed)
Attempted to reach patient about trich results. number not in service. Pt is treated. Pending G/C

## 2018-12-02 NOTE — Telephone Encounter (Signed)
Reviewed labs for this patient, pt only had trich ordered, note says add gonorrhea and chlamydia. Call cytology to add on to previous collection.

## 2018-12-06 ENCOUNTER — Telehealth (HOSPITAL_COMMUNITY): Payer: Self-pay | Admitting: Emergency Medicine

## 2018-12-06 NOTE — Telephone Encounter (Signed)
Called and spoke to cytology, pt has negative gonorrhea and chlamydia testing. Was able to reach the patient by phone and let him know G/C negative, trich positive and treated. All questions answered.

## 2018-12-14 ENCOUNTER — Other Ambulatory Visit: Payer: Self-pay

## 2018-12-14 ENCOUNTER — Ambulatory Visit (HOSPITAL_COMMUNITY)
Admission: EM | Admit: 2018-12-14 | Discharge: 2018-12-14 | Disposition: A | Discharge status: Home / Self Care | Payer: 59 | Attending: Family Medicine | Admitting: Family Medicine

## 2018-12-14 ENCOUNTER — Encounter (HOSPITAL_COMMUNITY): Payer: Self-pay

## 2018-12-14 DIAGNOSIS — Z113 Encounter for screening for infections with a predominantly sexual mode of transmission: Secondary | ICD-10-CM | POA: Insufficient documentation

## 2018-12-14 DIAGNOSIS — Z8619 Personal history of other infectious and parasitic diseases: Secondary | ICD-10-CM

## 2018-12-14 NOTE — ED Provider Notes (Signed)
[facility]    CSN: [id number] Arrival date & time: 12/14/18  1047      History   Chief Complaint Chief Complaint  Patient presents with  . SEXUALLY TRANSMITTED DISEASE    HPI Justin Novak is a 33 y.o. male.   Pt here to be rechecked for STDs. Treated for trichomoniasis over a week ago. Not having any symptoms.      Past Medical History:  Diagnosis Date  . Fatigue 08/13/2018  . History of ADHD   . Loss of alertness 08/10/2018  . Syncopal episodes 08/13/2018  . Syncope     Patient Active Problem List   Diagnosis Date Noted  . Chest pain of uncertain etiology 08/17/2018  . AKI (acute kidney injury) (HCC) 08/15/2018  . Syncope and collapse 08/14/2018  . Blood pressure elevated without history of HTN 08/14/2018  . Bradycardia 08/14/2018  . Fatigue 08/13/2018  . Syncopal episodes 08/13/2018  . Loss of alertness 08/10/2018    Past Surgical History:  Procedure Laterality Date  . NO PAST SURGERIES         Home Medications    Prior to Admission medications   Medication Sig Start Date End Date Taking? Authorizing Provider  ibuprofen (ADVIL) 200 MG tablet Take 1 tablet (200 mg total) by mouth every 6 (six) hours as needed. 10/26/18   Baldo Daub, MD  VYVANSE 30 MG capsule  11/23/18   [provider]    Family History Family History  Problem Relation Age of Onset  . Asthma Mother   . ADD / ADHD Father   . Healthy Sister   . Asthma Brother   . Healthy Sister     Social History Social History   Tobacco Use  . Smoking status: Never Smoker  . Smokeless tobacco: Never Used  Substance Use Topics  . Alcohol use: No  . Drug use: Never     Allergies   Patient has no known allergies.   Review of Systems Review of Systems  Genitourinary: Negative for decreased urine volume, difficulty urinating, discharge, dysuria, enuresis, flank pain, frequency, genital sores, hematuria, penile pain, penile swelling, scrotal swelling, testicular  pain and urgency.     Physical Exam Triage Vital Signs ED Triage Vitals  Enc Vitals Group     BP 12/14/18 1140 133/75     Pulse Rate 12/14/18 1140 67     Resp 12/14/18 1140 18     Temp 12/14/18 1140 98.7 F (37.1 C)     Temp Source 12/14/18 1140 Oral     SpO2 12/14/18 1140 100 %     Weight 12/14/18 1139 190 lb (86.2 kg)     Height --      Head Circumference --      Peak Flow --      Pain Score 12/14/18 1139 0     Pain Loc --      Pain Edu? --      Excl. in GC? --    No data found.  Updated Vital Signs BP 133/75 (BP Location: Right Arm)   Pulse 67   Temp 98.7 F (37.1 C) (Oral)   Resp 18   Wt 190 lb (86.2 kg)   SpO2 100%   BMI 25.77 kg/m   Visual Acuity Right Eye Distance:   Left Eye Distance:   Bilateral Distance:    Right Eye Near:   Left Eye Near:    Bilateral Near:     Physical Exam Vitals signs and  nursing note reviewed.  Constitutional:      Appearance: Normal appearance.  HENT:     Head: Normocephalic and atraumatic.     Nose: Nose normal.  Eyes:     Conjunctiva/sclera: Conjunctivae normal.  Neck:     Musculoskeletal: Normal range of motion.  Pulmonary:     Effort: Pulmonary effort is normal.  Musculoskeletal: Normal range of motion.  Skin:    General: Skin is warm and dry.  Neurological:     Mental Status: He is alert.  Psychiatric:        Mood and Affect: Mood normal.      UC Treatments / Results  Labs (all labs ordered are listed, but only abnormal results are displayed) Labs Reviewed  CYTOLOGY, (ORAL, ANAL, URETHRAL) ANCILLARY ONLY    EKG   Radiology No results found.  Procedures Procedures (including critical care time)  Medications Ordered in UC Medications - No data to display  Initial Impression / Assessment and Plan / UC Course  I have reviewed the triage vital signs and the nursing notes.  Pertinent labs & imaging results that were available during my care of the patient were reviewed by me and considered in  my medical decision making (see chart for details).     STD screening. Sent swab for testing. Labs pending.  Final Clinical Impressions(s) / UC Diagnoses   Final diagnoses:  Screening examination for STD (sexually transmitted disease)     Discharge Instructions     We will call you with any positive results.     ED Prescriptions    None     PDMP not reviewed this encounter.   Orvan July, NP 12/14/18 1245

## 2018-12-14 NOTE — ED Triage Notes (Signed)
Pt states he wants to be checked for STDs 

## 2018-12-14 NOTE — Discharge Instructions (Addendum)
We will call you with any positive results.

## 2018-12-15 LAB — CYTOLOGY, (ORAL, ANAL, URETHRAL) ANCILLARY ONLY
Chlamydia: NEGATIVE
Neisseria Gonorrhea: NEGATIVE
Trichomonas: NEGATIVE

## 2019-07-23 IMAGING — MR MRI HEAD WITHOUT AND WITH CONTRAST
9 of 12 series · 34 of 48 positions shown · IV contrast (gadavist)
Comparison: Head CT 09/29/2017

CLINICAL DATA: Recurrent syncope

EXAM:
MRI HEAD WITHOUT AND WITH CONTRAST
TECHNIQUE: Multiplanar, multiecho pulse sequences of the brain and surrounding
structures were obtained without and with intravenous contrast.
CONTRAST:  8.3 mL Gadavist

[Series 4: DWI · axial · 3.0mm · 1.09mm/px · z∈[-66,+93]mm · 9 of 108 slices shown (1 of 4)]
[im 1/108]
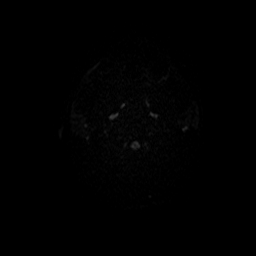
[im 14/108]
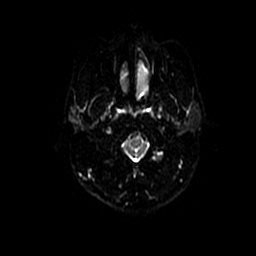
[im 27/108]
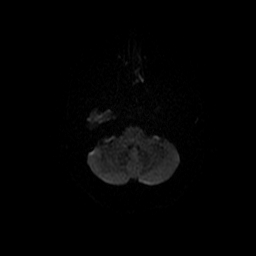
[im 41/108]
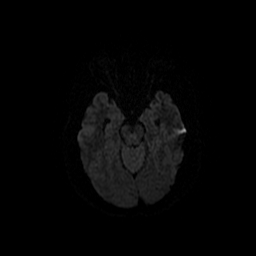
[im 54/108]
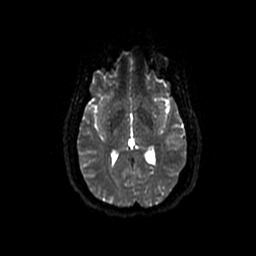
[im 67/108]
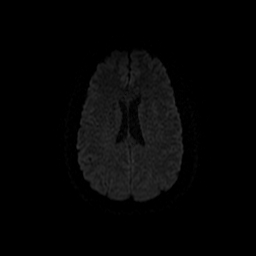
[im 81/108]
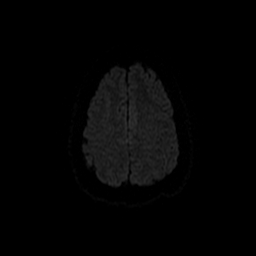
[im 94/108]
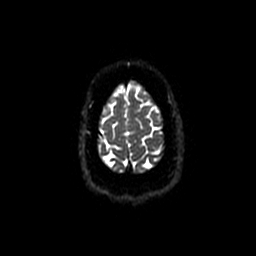
[im 108/108]
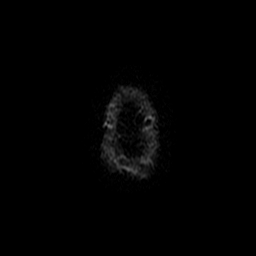

[Series 5: DWI · coronal · 5.0mm · 1.09mm/px · 6 of 80 slices shown (2 of 4)]
[im 1/80]
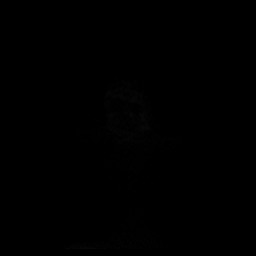
[im 16/80]
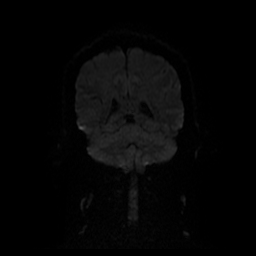
[im 32/80]
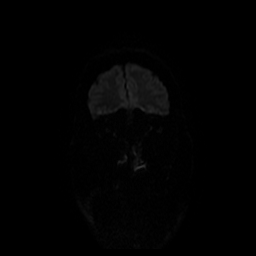
[im 48/80]
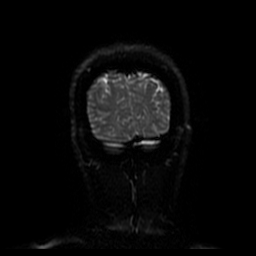
[im 64/80]
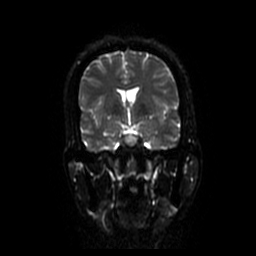
[im 80/80]
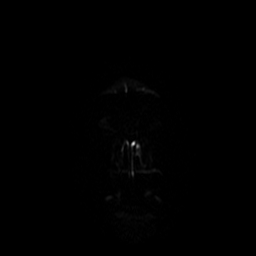

[Series 6: T2 · axial · 5.0mm · 0.55mm/px · z∈[-64,+91]mm · 2 of 27 slices shown]
[im 1/27]
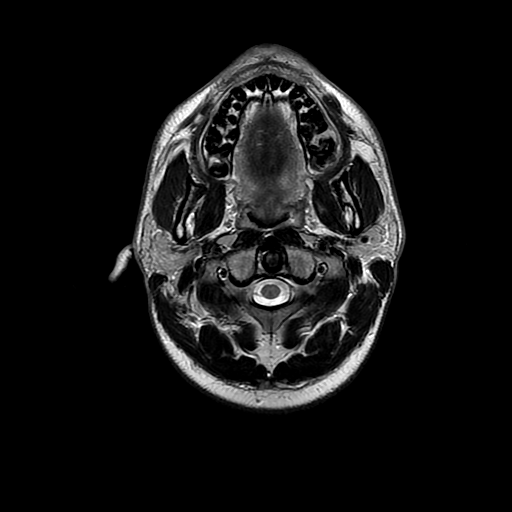
[im 27/27]
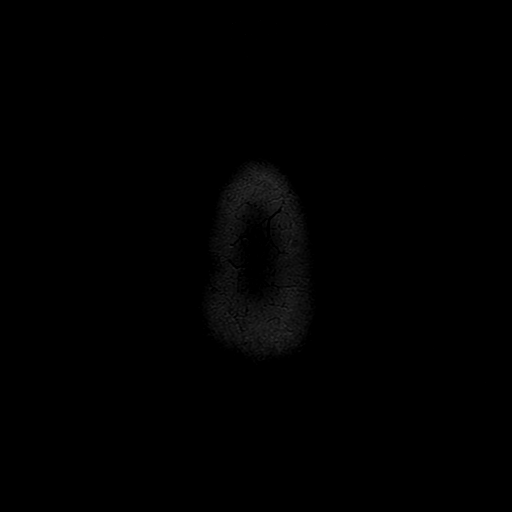

[Series 7: FLAIR · axial · 3.0mm · 0.55mm/px · z∈[-64,+91]mm · 2 of 27 slices shown]
[im 1/27]
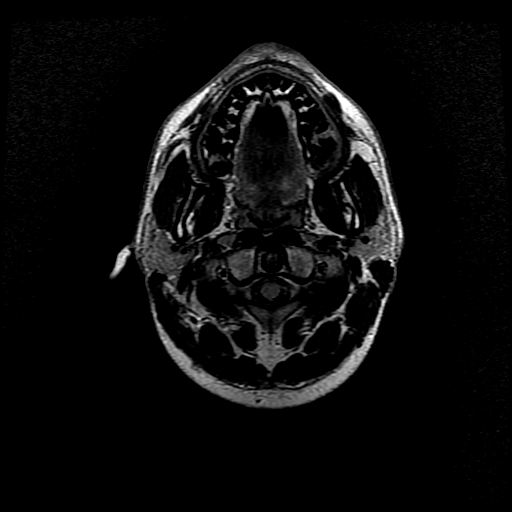
[im 27/27]
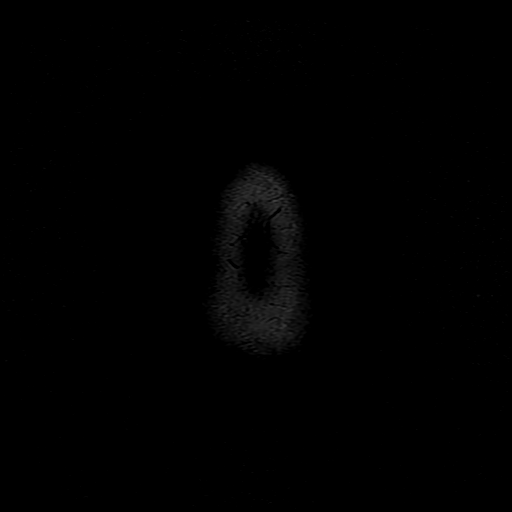

[Series 10: T2 post-contrast · coronal · 5.0mm · 0.55mm/px · 2 of 31 slices shown]
[im 1/31]
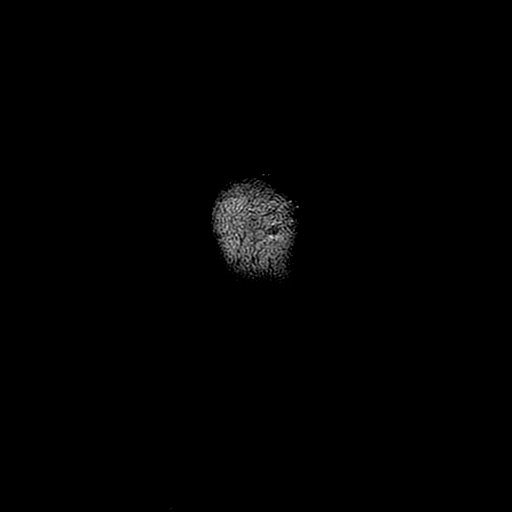
[im 31/31]
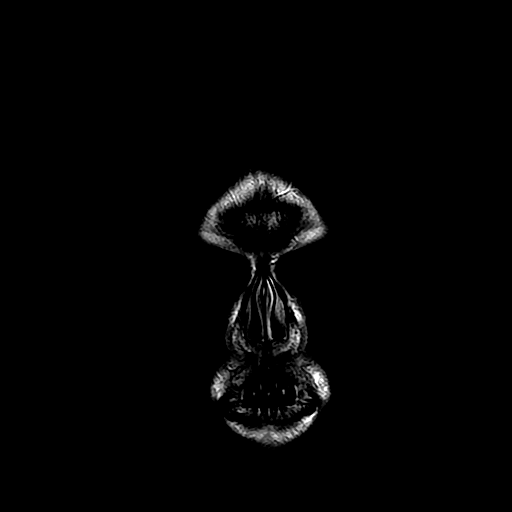

[Series 11: T1 post-contrast · axial · 3.0mm · 0.55mm/px · z∈[-66,+93]mm · 4 of 54 slices shown (1 of 2)]
[im 1/54]
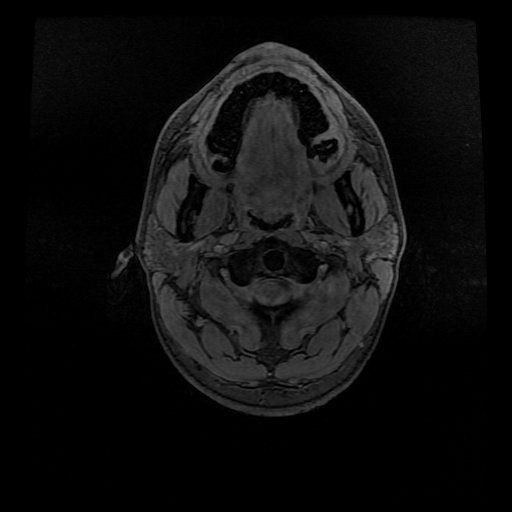
[im 18/54]
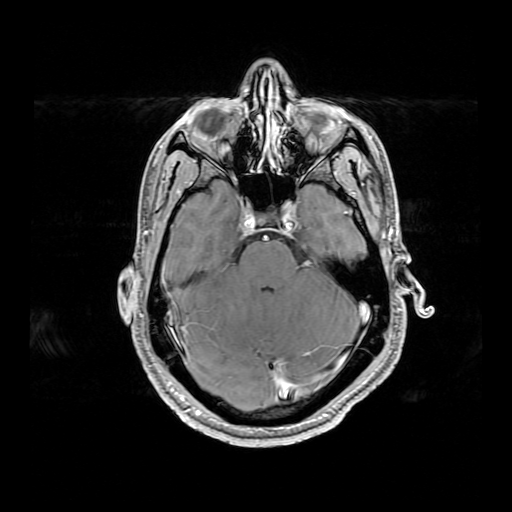
[im 36/54]
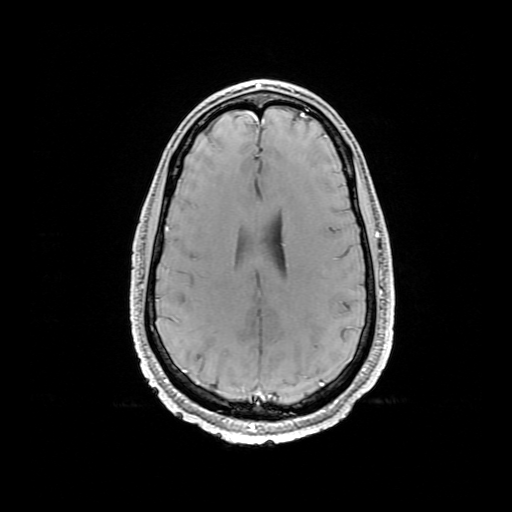
[im 54/54]
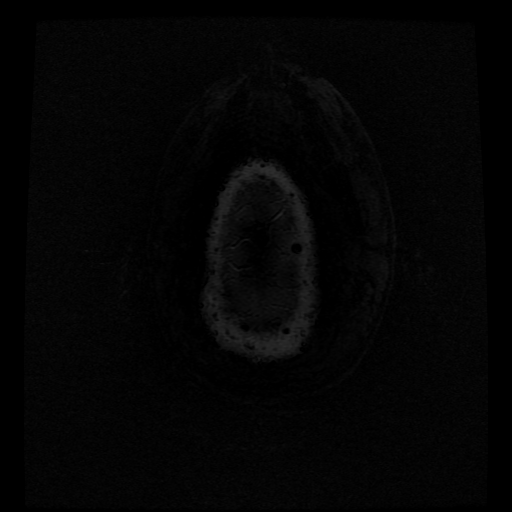

[Series 12: T1 post-contrast · coronal · 5.0mm · 0.55mm/px · 2 of 31 slices shown (2 of 2)]
[im 1/31]
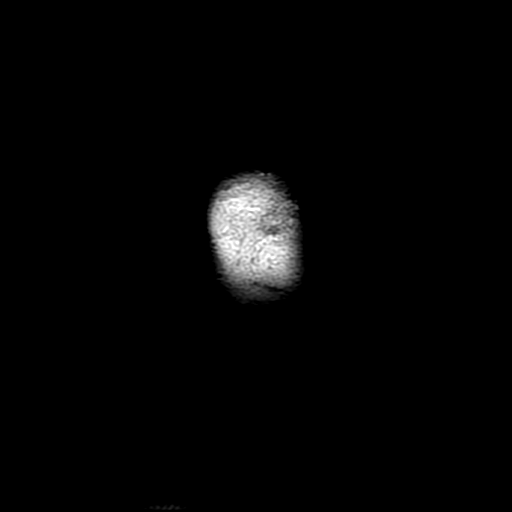
[im 31/31]
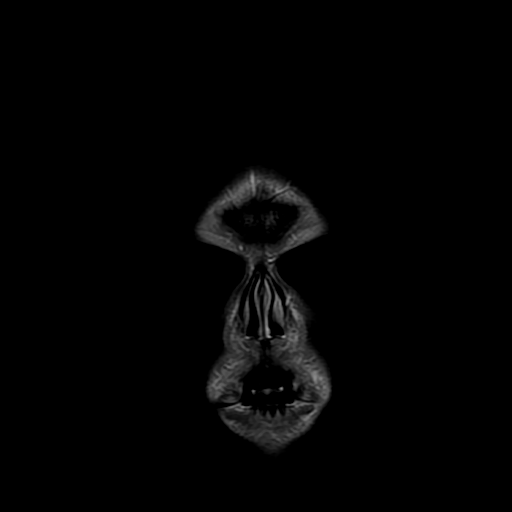

[Series 400: DWI · axial · 3.0mm · 1.09mm/px · z∈[-66,+93]mm · 4 of 54 slices shown (3 of 4)]
[im 1/54]
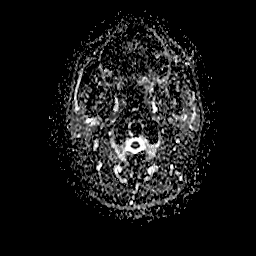
[im 18/54]
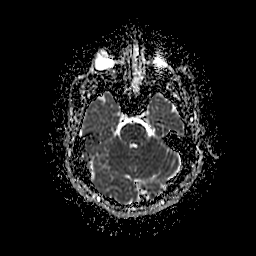
[im 36/54]
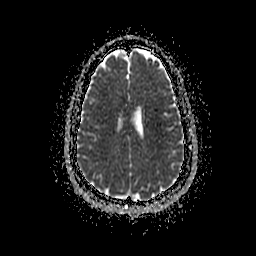
[im 54/54]
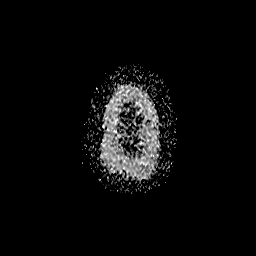

[Series 500: DWI · coronal · 5.0mm · 1.09mm/px · 3 of 40 slices shown (4 of 4)]
[im 1/40]
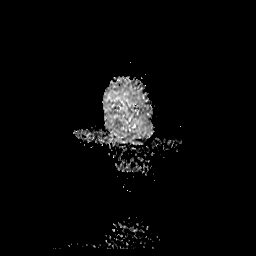
[im 20/40]
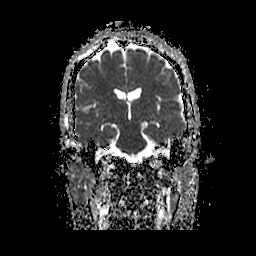
[im 40/40]
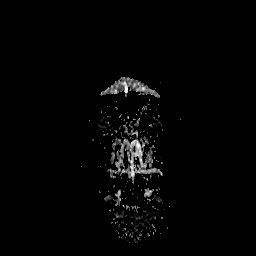

[34 of 48 positions shown; findings below may reference images not displayed]

FINDINGS: BRAIN: There is no acute infarct, acute hemorrhage or extra-axial
collection. The midline structures are normal. The white matter
signal is normal for the patient's age. The cerebral and cerebellar
volume are age-appropriate. No hydrocephalus.
Susceptibility-sensitive sequences show no chronic microhemorrhage
or superficial siderosis. No midline shift or other mass effect.

VASCULAR: The major intracranial arterial and venous sinus flow
voids are normal.

SKULL AND UPPER CERVICAL SPINE: Calvarial bone marrow signal is
normal. There is no skull base mass. Visualized upper cervical spine
and soft tissues are normal.

SINUSES/ORBITS: No fluid levels or advanced mucosal thickening. No
mastoid or middle ear effusion. The orbits are normal.
IMPRESSION: Normal brain MRI.

## 2019-10-05 ENCOUNTER — Other Ambulatory Visit: Payer: Self-pay | Admitting: Critical Care Medicine

## 2019-10-05 ENCOUNTER — Other Ambulatory Visit: Payer: 59

## 2019-10-05 DIAGNOSIS — Z20822 Contact with and (suspected) exposure to covid-19: Secondary | ICD-10-CM

## 2019-10-07 LAB — NOVEL CORONAVIRUS, NAA: SARS-CoV-2, NAA: NOT DETECTED

## 2019-10-07 LAB — SARS-COV-2, NAA 2 DAY TAT

## 2022-10-08 ENCOUNTER — Ambulatory Visit
Admission: EM | Admit: 2022-10-08 | Discharge: 2022-10-08 | Disposition: A | Discharge status: Home / Self Care | Payer: 59 | Attending: Internal Medicine | Admitting: Internal Medicine

## 2022-10-08 DIAGNOSIS — Z20822 Contact with and (suspected) exposure to covid-19: Secondary | ICD-10-CM | POA: Diagnosis not present

## 2022-10-08 DIAGNOSIS — U071 COVID-19: Secondary | ICD-10-CM | POA: Insufficient documentation

## 2022-10-08 DIAGNOSIS — B349 Viral infection, unspecified: Secondary | ICD-10-CM

## 2022-10-08 MED ORDER — PSEUDOEPHEDRINE HCL 30 MG PO TABS: 30.0000 mg | ORAL_TABLET | Freq: Three times a day (TID) | ORAL | 0 refills | Status: AC | PRN

## 2022-10-08 MED ORDER — PROMETHAZINE-DM 6.25-15 MG/5ML PO SYRP: 5.0000 mL | ORAL_SOLUTION | Freq: Three times a day (TID) | ORAL | 0 refills | Status: AC | PRN

## 2022-10-08 MED ORDER — CETIRIZINE HCL 10 MG PO TABS: 10.0000 mg | ORAL_TABLET | Freq: Every day | ORAL | 0 refills | Status: AC

## 2022-10-08 MED ORDER — IPRATROPIUM BROMIDE 0.03 % NA SOLN: 2.0000 | Freq: Two times a day (BID) | NASAL | 0 refills | Status: AC

## 2022-10-08 NOTE — ED Triage Notes (Signed)
Pt states he was exposed to someone with Covid. Pt states he has cough,runny nose an sore throat for the past 3 days.

## 2022-10-08 NOTE — ED Provider Notes (Signed)
[facility]  Note:  This document was prepared using Conservation officer, historic buildings and may include unintentional dictation errors.  MRN: [id number] DOB: 02/12/1986  Subjective:   Justin Novak is a 37 y.o. male presenting for 3-day history of persistent runny nose, throat pain, coughing.  Had close exposure to COVID-19 and he would like a test.  No chest pain, shob, wheezing. No smoking of any kind including cigarettes, cigars, vaping, marijuana use.  No asthma.   No current facility-administered medications for this encounter.  Current Outpatient Medications:    cetirizine (ZYRTEC) 10 MG tablet, Take 10 mg by mouth daily., Disp: , Rfl:    ibuprofen (ADVIL) 200 MG tablet, Take 1 tablet (200 mg total) by mouth every 6 (six) hours as needed., Disp: , Rfl: 0   VYVANSE 30 MG capsule, , Disp: , Rfl:    No Known Allergies  Past Medical History:  Diagnosis Date   Fatigue 08/13/2018   History of ADHD    Loss of alertness 08/10/2018   Syncopal episodes 08/13/2018   Syncope      Past Surgical History:  Procedure Laterality Date   NO PAST SURGERIES      Family History  Problem Relation Age of Onset   Asthma Mother    ADD / ADHD Father    Healthy Sister    Asthma Brother    Healthy Sister     Social History   Tobacco Use   Smoking status: Never   Smokeless tobacco: Never  Vaping Use   Vaping status: Never Used  Substance Use Topics   Alcohol use: No   Drug use: Never    ROS   Objective:   Vitals: BP 123/78 (BP Location: Left Arm)   Pulse 62   Temp 98.7 F (37.1 C) (Oral)   Resp 16   SpO2 98%   Physical Exam Constitutional:      General: He is not in acute distress.    Appearance: Normal appearance. He is well-developed and normal weight. He is not ill-appearing, toxic-appearing or diaphoretic.  HENT:     Head: Normocephalic and atraumatic.     Right Ear: Tympanic membrane, ear canal and external ear normal. No drainage, swelling  or tenderness. No middle ear effusion. There is no impacted cerumen. Tympanic membrane is not erythematous or bulging.     Left Ear: Tympanic membrane, ear canal and external ear normal. No drainage, swelling or tenderness.  No middle ear effusion. There is no impacted cerumen. Tympanic membrane is not erythematous or bulging.     Nose: Congestion present. No rhinorrhea.     Mouth/Throat:     Mouth: Mucous membranes are moist.     Pharynx: No pharyngeal swelling, oropharyngeal exudate, posterior oropharyngeal erythema or uvula swelling.     Tonsils: No tonsillar exudate or tonsillar abscesses. 0 on the right. 0 on the left.  Eyes:     General: No scleral icterus.       Right eye: No discharge.        Left eye: No discharge.     Extraocular Movements: Extraocular movements intact.     Conjunctiva/sclera: Conjunctivae normal.  Cardiovascular:     Rate and Rhythm: Normal rate and regular rhythm.     Heart sounds: Normal heart sounds. No murmur heard.    No friction rub. No gallop.  Pulmonary:     Effort: Pulmonary effort is normal. No respiratory distress.     Breath sounds: Normal breath  sounds. No stridor. No wheezing, rhonchi or rales.  Musculoskeletal:     Cervical back: Normal range of motion and neck supple. No rigidity. No muscular tenderness.  Neurological:     General: No focal deficit present.     Mental Status: He is alert and oriented to person, place, and time.  Psychiatric:        Mood and Affect: Mood normal.        Behavior: Behavior normal.        Thought Content: Thought content normal.        Judgment: Judgment normal.     Assessment and Plan :   PDMP not reviewed this encounter.  1. Acute viral syndrome     Deferred imaging given clear cardiopulmonary exam, hemodynamically stable vital signs.  Will manage for viral illness such as viral URI, viral syndrome, viral rhinitis, COVID-19. Recommended supportive care. Offered scripts for symptomatic relief. Testing is  pending. Counseled patient on potential for adverse effects with medications prescribed/recommended today, ER and return-to-clinic precautions discussed, patient verbalized understanding.     Wallis Bamberg, [place] 10/08/22 1302

## 2022-10-08 NOTE — Discharge Instructions (Addendum)
We will notify you of your test results as they arrive and may take between about 24 hours.  I encourage you to sign up for MyChart if you have not already done so as this can be the easiest way for [place] to communicate results to you online or through a phone app.  Generally, we only contact you if it is a positive test result.  In the meantime, if you develop worsening symptoms including fever, chest pain, shortness of breath despite our current treatment plan then please report to the emergency room as this may be a sign of worsening status from possible viral infection.  Otherwise, we will manage this as a viral syndrome. For sore throat or cough try using a honey-based tea. Use 3 teaspoons of honey with juice squeezed from half lemon. Place shaved pieces of ginger into 1/2-1 cup of water and warm over stove top. Then mix the ingredients and repeat every 4 hours as needed. Please take Tylenol 500mg -650mg  every 6 hours for aches and pains, fevers. Hydrate very well with at least 2 liters of water. Eat light meals such as soups to replenish electrolytes and soft fruits, veggies. Start an antihistamine like Zyrtec (10mg  daily) for postnasal drainage, sinus congestion.  You can take this together with Atrovent nasal spray as needed for the same kind of congestion.  Use the cough medications as needed.

## 2022-10-09 LAB — SARS CORONAVIRUS 2 (TAT 6-24 HRS): SARS Coronavirus 2: POSITIVE — AB

## 2023-01-04 ENCOUNTER — Encounter (HOSPITAL_COMMUNITY): Payer: Self-pay

## 2023-01-04 ENCOUNTER — Emergency Department (HOSPITAL_COMMUNITY)
Admission: EM | Admit: 2023-01-04 | Discharge: 2023-01-04 | Disposition: A | Discharge status: Home / Self Care | Payer: 59 | Attending: Emergency Medicine | Admitting: Emergency Medicine

## 2023-01-04 DIAGNOSIS — Z79899 Other long term (current) drug therapy: Secondary | ICD-10-CM | POA: Diagnosis not present

## 2023-01-04 DIAGNOSIS — T6594XA Toxic effect of unspecified substance, undetermined, initial encounter: Secondary | ICD-10-CM | POA: Diagnosis not present

## 2023-01-04 DIAGNOSIS — S0501XA Injury of conjunctiva and corneal abrasion without foreign body, right eye, initial encounter: Secondary | ICD-10-CM

## 2023-01-04 DIAGNOSIS — S0591XA Unspecified injury of right eye and orbit, initial encounter: Secondary | ICD-10-CM | POA: Diagnosis present

## 2023-01-04 DIAGNOSIS — H10213 Acute toxic conjunctivitis, bilateral: Secondary | ICD-10-CM

## 2023-01-04 DIAGNOSIS — Y92524 Gas station as the place of occurrence of the external cause: Secondary | ICD-10-CM | POA: Insufficient documentation

## 2023-01-04 DIAGNOSIS — X58XXXA Exposure to other specified factors, initial encounter: Secondary | ICD-10-CM | POA: Insufficient documentation

## 2023-01-04 DIAGNOSIS — S0502XA Injury of conjunctiva and corneal abrasion without foreign body, left eye, initial encounter: Secondary | ICD-10-CM | POA: Insufficient documentation

## 2023-01-04 MED ORDER — TETRACAINE HCL 0.5 % OP SOLN
2.0000 [drp] | Freq: Once | OPHTHALMIC | Status: AC
  Administered 2023-01-04: 2 [drp] via OPHTHALMIC
  Filled 2023-01-04: qty 4

## 2023-01-04 MED ORDER — NEOMYCIN-POLYMYXIN-HC 3.5-10000-1 OP SUSP: 4.0000 [drp] | Freq: Four times a day (QID) | OPHTHALMIC | 0 refills | Status: AC

## 2023-01-04 MED ORDER — ACETAMINOPHEN 325 MG PO TABS
650.0000 mg | ORAL_TABLET | Freq: Once | ORAL | Status: AC
Start: 2023-01-04 — End: 2023-01-04
  Administered 2023-01-04: 650 mg via ORAL
  Filled 2023-01-04: qty 2

## 2023-01-04 MED ORDER — NEOMYCIN-POLYMYXIN-DEXAMETH 3.5-10000-0.1 OP OINT
TOPICAL_OINTMENT | Freq: Once | OPHTHALMIC | Status: AC
  Filled 2023-01-04: qty 3.5

## 2023-01-04 MED ORDER — FLUORESCEIN SODIUM 1 MG OP STRP
1.0000 | ORAL_STRIP | Freq: Once | OPHTHALMIC | Status: AC
  Administered 2023-01-04: 1 via OPHTHALMIC
  Filled 2023-01-04: qty 1
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
## 2023-01-04 NOTE — ED Notes (Signed)
Visual Acuity Screening  Left eye 20/70  Right eye 20/70  Both eyes 20/30

## 2023-01-04 NOTE — ED Notes (Signed)
ED Provider at bedside. 
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
## 2023-01-04 NOTE — Discharge Instructions (Signed)
Please call the ophthalmologist at the number provided tomorrow at 8 AM and ensure that you are seen tomorrow for repeat assessment.  Start applying the antibiotic ointment that is prescribed.

## 2023-08-27 ENCOUNTER — Encounter (HOSPITAL_COMMUNITY): Payer: Self-pay | Admitting: Emergency Medicine

## 2023-08-27 ENCOUNTER — Emergency Department (HOSPITAL_COMMUNITY)
Admission: EM | Admit: 2023-08-27 | Discharge: 2023-08-27 | Disposition: A | Discharge status: Home / Self Care | Attending: Emergency Medicine | Admitting: Emergency Medicine

## 2023-08-27 ENCOUNTER — Other Ambulatory Visit: Payer: Self-pay

## 2023-08-27 ENCOUNTER — Emergency Department (HOSPITAL_COMMUNITY)

## 2023-08-27 DIAGNOSIS — Z23 Encounter for immunization: Secondary | ICD-10-CM | POA: Insufficient documentation

## 2023-08-27 DIAGNOSIS — Y990 Civilian activity done for income or pay: Secondary | ICD-10-CM | POA: Insufficient documentation

## 2023-08-27 DIAGNOSIS — S61012A Laceration without foreign body of left thumb without damage to nail, initial encounter: Secondary | ICD-10-CM | POA: Diagnosis present

## 2023-08-27 DIAGNOSIS — W268XXA Contact with other sharp object(s), not elsewhere classified, initial encounter: Secondary | ICD-10-CM | POA: Insufficient documentation

## 2023-08-27 MED ORDER — TETANUS-DIPHTH-ACELL PERTUSSIS 5-2.5-18.5 LF-MCG/0.5 IM SUSY
0.5000 mL | PREFILLED_SYRINGE | Freq: Once | INTRAMUSCULAR | Status: AC
  Administered 2023-08-27: 0.5 mL via INTRAMUSCULAR
  Filled 2023-08-27: qty 0.5

## 2023-08-27 MED ORDER — BACITRACIN ZINC 500 UNIT/GM EX OINT
TOPICAL_OINTMENT | CUTANEOUS | Status: AC
  Filled 2023-08-27: qty 0.9

## 2023-08-27 MED ORDER — HYDROCODONE-ACETAMINOPHEN 5-325 MG PO TABS: 1.0000 | ORAL_TABLET | Freq: Four times a day (QID) | ORAL | 0 refills | Status: AC | PRN

## 2023-08-27 NOTE — ED Provider Notes (Addendum)
 [place] EMERGENCY DEPARTMENT AT [facility] Provider Note   CSN: [id number] Arrival date & time: 08/27/23  1500   Patient presents with: Finger Injury   Justin Novak is a 38 y.o. male with no significant past medical history who presents for evaluation of injury of the left thumb.  Patient states that yesterday around 7 AM at work, he was setting up an arrow board when one of the metal rods shot out and sliced his left thumb.  He held pressure for a little which controlled the bleeding, so he did not seek medical care until today due to increased pain.  He has otherwise been covering with a Band-Aid and covering the L hand in a glove while at work today.  Last Tdap was 2018.  He endorses swelling and pain of the left thumb, denies any other injuries aside from a small superficial abrasion at the base of the left pinky that did not bleed.   On initial evaluation patient also complaining of mild dizziness, states he has not eaten or drink anything since breakfast this morning.  Denies any blurred vision, headaches, falls, or confusion.  Was working outside in the heat prior to arrival to ED.   Prior to Admission medications   Medication Sig Start Date End Date Taking? Authorizing Provider  HYDROcodone -acetaminophen  (NORCO/VICODIN) 5-325 MG tablet Take 1 tablet by mouth every 6 (six) hours as needed for up to 2 days for severe pain (pain score 7-10). 08/27/23 08/29/23 Yes Raoul Rake, MD  cetirizine  (ZYRTEC  ALLERGY) 10 MG tablet Take 1 tablet (10 mg total) by mouth daily. 10/08/22   Christopher Savannah, PA-C  ibuprofen  (ADVIL ) 200 MG tablet Take 1 tablet (200 mg total) by mouth every 6 (six) hours as needed. 10/26/18   Monetta Redell PARAS, MD  ipratropium (ATROVENT ) 0.03 % nasal spray Place 2 sprays into both nostrils 2 (two) times daily. 10/08/22   Christopher Savannah, PA-C  neomycin -polymyxin-hydrocortisone (CORTISPORIN) 3.5-10000-1 ophthalmic suspension Place 4 drops into the right eye 4 (four) times  daily. For 7 days 01/04/23   Charlyn Sora, MD  promethazine -dextromethorphan (PROMETHAZINE -DM) 6.25-15 MG/5ML syrup Take 5 mLs by mouth 3 (three) times daily as needed for cough. 10/08/22   Christopher Savannah, PA-C  pseudoephedrine  (SUDAFED) 30 MG tablet Take 1 tablet (30 mg total) by mouth every 8 (eight) hours as needed for congestion. 10/08/22   Christopher Savannah, PA-C  VYVANSE 30 MG capsule  11/23/18   [provider]    Allergies: Patient has no known allergies.     Updated Vital Signs BP 131/72 (BP Location: Right Arm)   Pulse 70   Temp 98.5 F (36.9 C) (Oral)   Resp 16   SpO2 100%   Physical Exam Vitals reviewed.  Constitutional:      General: He is not in acute distress.    Appearance: He is not toxic-appearing or diaphoretic.  HENT:     Head: Normocephalic and atraumatic.     Nose: Nose normal. No rhinorrhea.     Mouth/Throat:     Mouth: Mucous membranes are moist.     Pharynx: Oropharynx is clear.  Eyes:     General: No scleral icterus.    Extraocular Movements: Extraocular movements intact.     Pupils: Pupils are equal, round, and reactive to light.  Cardiovascular:     Rate and Rhythm: Normal rate and regular rhythm.  Pulmonary:     Effort: Pulmonary effort is normal. No respiratory distress.  Musculoskeletal:  General: Tenderness (tenderness of entire thumb to the base) and signs of injury (avulsed superficial skin layer of pad of L thumb, no active bleeding. Point tenderness to the base of L thumb with no notable deformity but mild crepitus) present.     Cervical back: Normal range of motion and neck supple. No rigidity.     Comments: Slightly limited ROM of L thumb flexion d/t pain and swelling, otherwise normal ROM of L thumb and remainder of L hand/LUE  Skin:    General: Skin is warm and dry.     Capillary Refill: Capillary refill takes less than 2 seconds.  Neurological:     General: No focal deficit present.     Mental Status: He is alert and oriented  to person, place, and time.     Sensory: No sensory deficit.     (all labs ordered are listed, but only abnormal results are displayed) Labs Reviewed - No data to display  EKG: None  Radiology: DG Hand 2 View Left Result Date: 08/27/2023 CLINICAL DATA:  Left thumb injury and pain. EXAM: LEFT HAND - 2 VIEW COMPARISON:  None Available. FINDINGS: There is no evidence of fracture or dislocation. There is no evidence of arthropathy or other focal bone abnormality. Soft tissues are unremarkable. IMPRESSION: Negative. Electronically Signed   By: Lynwood Landy Raddle M.D.   On: 08/27/2023 16:23     Medications Ordered in the ED  Tdap (BOOSTRIX ) injection 0.5 mL (0.5 mLs Intramuscular Given 08/27/23 1633)  bacitracin  500 UNIT/GM ointment (  Given 08/27/23 1646)   Clinical Course as of 08/28/23 1247  Fri Aug 28, 2023  1231 DG Hand 2 View Left There is no evidence of fracture or dislocation. There is no evidence of arthropathy or other focal bone abnormality. Soft tissues are unremarkable.   [AD]    Clinical Course User Index [AD] Raoul Rake, MD     Medical Decision Making Patient with no PMHx who presents with slice injury of the pad of the left thumb that occurred >24 hours ago at work yesterday morning. On exam, patient's wound is hemostatic and shows avulsion of the superficial layer of the pad of his L thumb but no deep lacerations requiring repair or other injuries. He has mild swelling of the thumb and slightly decreased ROM d/t pain/swelling, with some focal tenderness at the base of the thumb. For this reason, will get L hand XR to rule out fracture/dislocation from the injury.  Will also update tdap (last booster 2018) and thoroughly wash out the wound and apply bacitracin  and clean dressing. Patient complaining of mild dizziness in setting of not having eaten or drank anything since earlier this morning and has been working outside in the heat since, will give snacks and drink and  reassess. As above in ED course, L hand XR resulted with no acute findings. Patient was updated on results and also endorsed improvement in his dizziness after po. Patient politely declined work note, and was given a 2 day course of norco for pain while working the next few days. He was instructed to keep the wound clean and dry and change bandage/apply bacitracin  at least once daily.  Amount and/or Complexity of Data Reviewed Radiology: ordered. Decision-making details documented in ED Course.  Risk Prescription drug management.    Final diagnoses:  Laceration of left thumb without foreign body without damage to nail, initial encounter    ED Discharge Orders  Ordered    HYDROcodone -acetaminophen  (NORCO/VICODIN) 5-325 MG tablet  Every 6 hours PRN        08/27/23 1711               Ameris Akamine, MD 08/28/23 1247    Horton, Roxie HERO, DO 09/05/23 1518

## 2023-08-27 NOTE — Discharge Instructions (Addendum)
 You were seen today for left thumb injury. While you were here we monitored your vitals, preformed a physical exam, and XR of the left thumb. These were all reassuring and there is no indication for any further testing or intervention in the emergency department at this time.   Things to do:  - Follow up with your primary care provider within the next 1-2 weeks as needed - Take norco pain medication every 6 hours as needed for severe pain over the next 2 days, otherwise alternate taking ibuprofen /tylenol  OTC for mild-moderate pain - Ensure that you are keeping the L thumb wound clean and dry, changing the bandage at least once daily and applying antibacterial ointment when you change the bandage  Return to the emergency department if you have any new or worsening symptoms, or if you have any other serious medical concerns.

## 2023-08-27 NOTE — ED Triage Notes (Signed)
 Pt reports cutting his left thumb on arrow board yesterday. Bleeding controlled at this time.
